# Patient Record
Sex: Male | Born: 1970
Health system: Southern US, Community
[De-identification: ages and names within clinical notes are randomized; demographics above are authoritative.]

## PROBLEM LIST (undated history)

## (undated) DIAGNOSIS — E119 Type 2 diabetes mellitus without complications: Secondary | ICD-10-CM

## (undated) DIAGNOSIS — I10 Essential (primary) hypertension: Secondary | ICD-10-CM

## (undated) DIAGNOSIS — K219 Gastro-esophageal reflux disease without esophagitis: Secondary | ICD-10-CM

## (undated) DIAGNOSIS — E669 Obesity, unspecified: Secondary | ICD-10-CM

## (undated) HISTORY — PX: EYE SURGERY: SHX253

---

## 2014-05-03 ENCOUNTER — Emergency Department (HOSPITAL_COMMUNITY): Payer: Managed Care, Other (non HMO)

## 2014-05-03 ENCOUNTER — Encounter (HOSPITAL_COMMUNITY): Payer: Self-pay | Admitting: Emergency Medicine

## 2014-05-03 ENCOUNTER — Observation Stay (HOSPITAL_COMMUNITY)
Admission: EM | Admit: 2014-05-03 | Discharge: 2014-05-05 | Disposition: A | Discharge status: Home / Self Care | Payer: Managed Care, Other (non HMO) | Attending: Family Medicine | Admitting: Family Medicine

## 2014-05-03 DIAGNOSIS — R079 Chest pain, unspecified: Secondary | ICD-10-CM | POA: Diagnosis not present

## 2014-05-03 DIAGNOSIS — I10 Essential (primary) hypertension: Secondary | ICD-10-CM | POA: Insufficient documentation

## 2014-05-03 DIAGNOSIS — Z79899 Other long term (current) drug therapy: Secondary | ICD-10-CM | POA: Insufficient documentation

## 2014-05-03 DIAGNOSIS — R61 Generalized hyperhidrosis: Secondary | ICD-10-CM | POA: Diagnosis not present

## 2014-05-03 DIAGNOSIS — E669 Obesity, unspecified: Secondary | ICD-10-CM | POA: Insufficient documentation

## 2014-05-03 DIAGNOSIS — R197 Diarrhea, unspecified: Secondary | ICD-10-CM | POA: Insufficient documentation

## 2014-05-03 DIAGNOSIS — R110 Nausea: Secondary | ICD-10-CM | POA: Insufficient documentation

## 2014-05-03 DIAGNOSIS — R072 Precordial pain: Secondary | ICD-10-CM

## 2014-05-03 DIAGNOSIS — R42 Dizziness and giddiness: Secondary | ICD-10-CM | POA: Diagnosis not present

## 2014-05-03 DIAGNOSIS — E663 Overweight: Secondary | ICD-10-CM

## 2014-05-03 DIAGNOSIS — R000 Tachycardia, unspecified: Secondary | ICD-10-CM | POA: Insufficient documentation

## 2014-05-03 DIAGNOSIS — Z6841 Body Mass Index (BMI) 40.0 and over, adult: Secondary | ICD-10-CM

## 2014-05-03 HISTORY — DX: Obesity, unspecified: E66.9

## 2014-05-03 HISTORY — DX: Essential (primary) hypertension: I10

## 2014-05-03 LAB — CBC
HCT: 46.1 % (ref 39.0–52.0)
HEMOGLOBIN: 15 g/dL (ref 13.0–17.0)
MCH: 28.4 pg (ref 26.0–34.0)
MCHC: 32.5 g/dL (ref 30.0–36.0)
MCV: 87.1 fL (ref 78.0–100.0)
PLATELETS: 343 10*3/uL (ref 150–400)
RBC: 5.29 MIL/uL (ref 4.22–5.81)
RDW: 12.8 % (ref 11.5–15.5)
WBC: 10 10*3/uL (ref 4.0–10.5)

## 2014-05-03 LAB — BASIC METABOLIC PANEL
ANION GAP: 16 — AB (ref 5–15)
BUN: 15 mg/dL (ref 6–23)
CALCIUM: 10 mg/dL (ref 8.4–10.5)
CHLORIDE: 94 meq/L — AB (ref 96–112)
CO2: 27 meq/L (ref 19–32)
Creatinine, Ser: 0.81 mg/dL (ref 0.50–1.35)
GFR calc Af Amer: >90 mL/min (ref >90)
GFR calc non Af Amer: >90 mL/min (ref >90)
GLUCOSE: 102 mg/dL — AB (ref 70–99)
Potassium: 3.9 mEq/L (ref 3.7–5.3)
Sodium: 137 mEq/L (ref 137–147)

## 2014-05-03 LAB — I-STAT TROPONIN, ED: Troponin i, poc: 0.02 ng/mL (ref 0.00–0.08)

## 2014-05-03 LAB — PRO B NATRIURETIC PEPTIDE: Pro B Natriuretic peptide (BNP): <5 pg/mL (ref 0–125)

## 2014-05-03 MED ORDER — ASPIRIN 81 MG PO CHEW
324.0000 mg | CHEWABLE_TABLET | Freq: Once | ORAL | Status: AC
  Administered 2014-05-03: 324 mg via ORAL
  Filled 2014-05-03: qty 4

## 2014-05-03 MED ORDER — SODIUM CHLORIDE 0.9 % IV BOLUS (SEPSIS)
1000.0000 mL | Freq: Once | INTRAVENOUS | Status: AC
  Administered 2014-05-03: 1000 mL via INTRAVENOUS

## 2014-05-03 MED ORDER — NITROGLYCERIN 0.4 MG SL SUBL
0.4000 mg | SUBLINGUAL_TABLET | SUBLINGUAL | Status: DC | PRN
  Administered 2014-05-03 (×2): 0.4 mg via SUBLINGUAL
  Filled 2014-05-03: qty 1

## 2014-05-03 NOTE — H&P (Signed)
Family Medicine Teaching [facility] Admission History and Physical Service Pager: [phone]  Patient name: Edwin SilversmithJoseph Luster Medical record number: [id number] Date of birth: 05/10/1971 Age: 43 y.o. Gender: male  Primary Care Provider: No primary provider on file. Consultants: None Code Status: Full code  Chief Complaint: Chest pain  Assessment and Plan: Edwin Cox is a 43 y.o. male presenting with substernal chest pain. PMH is significant for HTN, morbid obesity, former smoker, GERD.  #Chest pain: Typical sounding chest pain (substernal pressure, radiation to L arm, diaphoresis, relieved with nitro).  RFs for ACS include HTN, morbid obesity and former smoker. No EKG changes, iStat troponin negative.  Must also consider GI etiology given h/o GERD (though has been asymptomatic off medications since he quit smoking ~3 yrs ago). Consider PNA (though no fever and normal CXR), pneumothorax (unlikely given normal CXR), and PE (given tachycardia, unlikely though given no O2 requirement or tachypnea, and no risk factors makes this less likely). - Observation on tele unit, attending McDiarmid - Cycle Troponins x3 - Repeat EKG in AM - Risk stratification labs in AM: Hgb A1c, TSH, lipid panel - Cardiology consult in AM (per chest pain order-set) - SL Nitro prn, Tylenol prn for pain control - Aspirin 325mg  daily - monitor on telemetry O/N   # Sinus tachycardia: Tachycardic to 130 in ED. Likely related to pain and volume depletion. Improving s/p Nitro and getting IVF currently. No signs concerning for PE currently (no dyspnea, O2 requirement, recent travel/surgery/trauma, hemoptysis, calf tenderness). - Continue to monitor on telemetry  #HTN: Mildly hypotensive after receiving Nitroglycerin. Now well controlled. - Continue home Lisinopril. Patient unsure of dose. Will start on 10mg  daily and confirm dose with pharmacy in AM. - Continue to monitor closely  FEN/GI: Heart healthy diet,  SLIV Prophylaxis: Lovenox  Disposition: Admit to FPTS for observation, attending McDiarmid. Dispo pending cardiac eval.  History of Present Illness: Edwin SilversmithJoseph Cantin is a 43 y.o. male presenting with chest pain.  PMH significant for HTN, former smoker, morbid obesity, GERD.  Patient was in his usual state of health until around 5pm.  He was sitting in his office at work and upon standing felt sudden onset substernal chest pain associated with nausea, diaphoresis.  Pain radiated down his L arm.  He describes the pain as squeezing and pressure.  After pain was present for ~1h, patient came to Urgent care as he was concerned that he could be having an MI.  He was sent to the ED from there.    He has h/o GERD and used to take medication for it.  After he quit smoking ~3564yrs ago, he no longer needed medication and has had no symptoms since.  He denies that this feels like his GERD symptoms did.  Denies any recent travel/trauma/surgery, SOB, hemoptysis, abd pain.  In the ED: Aspirin 325mg  x1, SL Nitro x2, 1L NS bolus -> Pain improved with nitroglycerin.  Review Of Systems: Per HPI with the following additions: none Otherwise 12 point review of systems was performed and was unremarkable.  Patient Active Problem List   Diagnosis Date Noted  . Chest pain 05/03/2014   Past Medical History: Past Medical History  Diagnosis Date  . Hypertension   . Obesity    Past Surgical History: Past Surgical History  Procedure Laterality Date  . Eye surgery     Social History: History  Substance Use Topics  . Smoking status: Never Smoker   . Smokeless tobacco: Not on file  . Alcohol Use:  Yes   Additional social history: Former smoker for ~23 years, ~1PPD. Quit ~3 years ago. No EtOH/drugs.  Please also refer to relevant sections of EMR.  Family History: History reviewed. No pertinent family history. Allergies and Medications: No Known Allergies No current facility-administered medications on file  prior to encounter.   No current outpatient prescriptions on file prior to encounter.    Objective: BP 129/88  Pulse 108  Temp(Src) 98.8 F (37.1 C) (Oral)  Resp 17  Ht 6' (1.829 m)  Wt 362 lb 12.8 oz (164.565 kg)  BMI 49.19 kg/m2  SpO2 97% Exam: General: Obese man sitting in bed in NAD HEENT: NCAT, EOMI, PERRL, MMM, OP clear Cardiovascular: Tachycardic, regular rhythm, no m/r/g Respiratory: CTAB, no w/r/c. Normal WOB, satting well on RA Abdomen: Obese, soft, NDNT. No masses/organomegaly appreciated, though difficult 2/2 habitus Extremities: WWP, no edema Skin: No rashes noted Neuro: CN 2-12 grossly intact. Speech normal. No focal deficits.  Labs and Imaging: CBC BMET   Recent Labs Lab 05/03/14 2015  WBC 10.0  HGB 15.0  HCT 46.1  PLT 343    Recent Labs Lab 05/03/14 2015  NA 137  K 3.9  CL 94*  CO2 27  BUN 15  CREATININE 0.81  GLUCOSE 102*  CALCIUM 10.0      iStat tropionin 0.02 ProBNP <5  CXR (10/29): No evidence of acute cardiopulmonary disease.  EKG (10/29): Sinus tachycardia, no evidence of ischemia   Shirlee LatchAngela Bacigalupo, MD 05/04/2014, 1:12 AM PGY-1, [facility] FPTS Intern pager: [phone], text pages welcome   Upper Level Addendum:  I have seen and evaluated this patient along with Dr. Beryle FlockBacigalupo and reviewed the above note, making necessary revisions in red.   Marikay AlarEric Jennett Tarbell, MD Family Medicine PGY-2

## 2014-05-03 NOTE — ED Notes (Signed)
Pt. reports mid chest pain / tightness with mild SOB onset this evening , headache and nausea .

## 2014-05-03 NOTE — ED Provider Notes (Signed)
CSN: [id number]     Arrival date & time 05/03/14  1946 History   First MD Initiated Contact with Patient 05/03/14 2058     Chief Complaint  Patient presents with  . Chest Pain   Patient is a 43 y.o. male presenting with chest pain. The history is provided by the patient.  Chest Pain Pain location:  L chest Pain quality: pressure   Pain quality comment:  "squeezing" Pain radiates to:  L arm Pain radiates to the back: no   Pain severity:  Moderate Onset quality:  Sudden Duration:  4 hours Timing:  Constant Progression:  Unchanged Chronicity:  New Context: not breathing and not at rest   Context comment:  Walking Relieved by:  None tried Worsened by:  Nothing tried Ineffective treatments:  None tried Associated symptoms: anxiety, diaphoresis and nausea   Associated symptoms: no abdominal pain, no altered mental status, no cough, no fever, no headache, no lower extremity edema, no palpitations, no syncope and not vomiting   Associated symptoms comment:  Lightheadedness Nausea:    Severity:  Severe   Onset quality:  Sudden   Duration:  4 hours   Timing:  Constant   Progression:  Partially resolved Risk factors: hypertension, male sex and obesity   Risk factors: no coronary artery disease, no diabetes mellitus, no high cholesterol, no immobilization, no prior DVT/PE and no smoking (former smoker; quit 2 years ago)     Past Medical History  Diagnosis Date  . Hypertension   . Obesity    Past Surgical History  Procedure Laterality Date  . Eye surgery     No family history on file. History  Substance Use Topics  . Smoking status: Never Smoker   . Smokeless tobacco: Not on file  . Alcohol Use: Yes    Review of Systems  Constitutional: Positive for diaphoresis. Negative for fever.  Respiratory: Negative for cough.   Cardiovascular: Positive for chest pain. Negative for palpitations, leg swelling and syncope.  Gastrointestinal: Positive for nausea and diarrhea. Negative  for vomiting and abdominal pain.  Musculoskeletal: Negative for gait problem.  Neurological: Positive for light-headedness. Negative for headaches.  All other systems reviewed and are negative.   Allergies  Review of patient's allergies indicates no known allergies.  Home Medications   Prior to Admission medications   Medication Sig Start Date End Date Taking? Authorizing Provider  LISINOPRIL PO Take 1 tablet by mouth daily.   Yes Historical Provider, MD  PHENTERMINE HCL PO Take 1 tablet by mouth daily.   Yes Historical Provider, MD   BP 138/94  Pulse 118  Temp(Src) 98.3 F (36.8 C) (Oral)  Resp 14  Ht 6' (1.829 m)  Wt 360 lb (163.295 kg)  BMI 48.81 kg/m2  SpO2 98%  Physical Exam  Vitals reviewed. Constitutional: He is oriented to person, place, and time. He appears well-developed and well-nourished. No distress.  HENT:  Head: Normocephalic and atraumatic.  Right Ear: External ear normal.  Left Ear: External ear normal.  Nose: Nose normal.  Mouth/Throat: Uvula is midline and oropharynx is clear and moist.  Eyes: EOM are normal. Pupils are equal, round, and reactive to light.  Neck: Normal range of motion.  Cardiovascular: Regular rhythm.  Tachycardia present.   Pulses:      Radial pulses are 2+ on the right side, and 2+ on the left side.       Dorsalis pedis pulses are 2+ on the right side, and 2+ on the left side.  No peripheral edema  Pulmonary/Chest: Effort normal and breath sounds normal. No respiratory distress. He has no wheezes. He has no rales.  Abdominal: Soft. He exhibits no distension. There is no tenderness. There is no rebound and no guarding.  Neurological: He is alert and oriented to person, place, and time.  Skin: Skin is warm and dry. No rash noted. He is not diaphoretic.  Psychiatric: His mood appears anxious.    ED Course  Procedures Labs Review  Results for orders placed during the hospital encounter of 05/03/14  CBC      Result Value Ref  Range   WBC 10.0  4.0 - 10.5 K/uL   RBC 5.29  4.22 - 5.81 MIL/uL   Hemoglobin 15.0  13.0 - 17.0 g/dL   HCT 72.546.1  36.639.0 - 44.052.0 %   MCV 87.1  78.0 - 100.0 fL   MCH 28.4  26.0 - 34.0 pg   MCHC 32.5  30.0 - 36.0 g/dL   RDW 34.712.8  42.511.5 - 95.615.5 %   Platelets 343  150 - 400 K/uL  BASIC METABOLIC PANEL      Result Value Ref Range   Sodium 137  137 - 147 mEq/L   Potassium 3.9  3.7 - 5.3 mEq/L   Chloride 94 (*) 96 - 112 mEq/L   CO2 27  19 - 32 mEq/L   Glucose, Bld 102 (*) 70 - 99 mg/dL   BUN 15  6 - 23 mg/dL   Creatinine, Ser 3.870.81  0.50 - 1.35 mg/dL   Calcium 56.410.0  8.4 - 33.210.5 mg/dL   GFR calc non Af Amer >90  >90 mL/min   GFR calc Af Amer >90  >90 mL/min   Anion gap 16 (*) 5 - 15  PRO B NATRIURETIC PEPTIDE      Result Value Ref Range   Pro B Natriuretic peptide (BNP) <5.0  0 - 125 pg/mL  I-STAT TROPOININ, ED      Result Value Ref Range   Troponin i, poc 0.02  0.00 - 0.08 ng/mL   Comment 3             Imaging Review Dg Chest 2 View  05/03/2014   CLINICAL DATA:  Chest pain, shortness of breath  EXAM: CHEST  2 VIEW  COMPARISON:  None.  FINDINGS: Lungs are clear.  No pleural effusion or pneumothorax.  The heart is normal in size.  Mild degenerative changes of the visualized thoracolumbar spine.  IMPRESSION: No evidence of acute cardiopulmonary disease.   Electronically Signed   By: Charline BillsSriyesh  Krishnan M.D.   On: 05/03/2014 21:02     EKG Interpretation   Date/Time:  Thursday May 03 2014 21:42:05 EDT Ventricular Rate:  113 PR Interval:  154 QRS Duration: 92 QT Interval:  324 QTC Calculation: 444 R Axis:   62 Text Interpretation:  Sinus tachycardia RSR' in V1 or V2, right VCD or RVH  No significant change was found Confirmed by CAMPOS  MD, Caryn BeeKEVIN ([id number]) on  05/03/2014 11:08:12 PM      MDM   Final diagnoses:  Chest pain    43 y.o. male with a history of obesity and hypertension presents to the ED due to sudden onset chest pain radiating to his left arm associated with  lightheadedness, nausea, and diaphoresis. Denies fever, chills, cough, abdominal pain. Has not smoked for 2 years. No first degree relative with ACS.   Pain relieved with aspirin and 2 sublingual nitroglycerin down to a "zero to  one".  Concern for ACS. Low suspicion for PE, Pneumonia, Pneumothorax, Aortic Dissection.   Initial troponin negative. BNP normal. CBC and BMP normal.  Repeat EKG with no changes from first EKG.   Given his presentation, very concerning for ACS, feel he needs serial troponin testing along with serial EKGs and a stress test.   He was admitted to family medicine. Repeat troponin pending at the time of admission.   This case managed in conjunction with my attending, Dr. Patria Maneampos.    Maxine GlennAnn Clemens Lachman, MD 05/03/14 [phone]

## 2014-05-04 ENCOUNTER — Encounter (HOSPITAL_COMMUNITY): Payer: Self-pay | Admitting: General Practice

## 2014-05-04 ENCOUNTER — Observation Stay (HOSPITAL_COMMUNITY): Payer: Managed Care, Other (non HMO)

## 2014-05-04 DIAGNOSIS — R079 Chest pain, unspecified: Secondary | ICD-10-CM

## 2014-05-04 DIAGNOSIS — I517 Cardiomegaly: Secondary | ICD-10-CM

## 2014-05-04 DIAGNOSIS — I10 Essential (primary) hypertension: Secondary | ICD-10-CM

## 2014-05-04 DIAGNOSIS — I471 Supraventricular tachycardia: Secondary | ICD-10-CM

## 2014-05-04 DIAGNOSIS — Z6841 Body Mass Index (BMI) 40.0 and over, adult: Secondary | ICD-10-CM

## 2014-05-04 DIAGNOSIS — E669 Obesity, unspecified: Secondary | ICD-10-CM

## 2014-05-04 LAB — CREATININE, SERUM
Creatinine, Ser: 0.86 mg/dL (ref 0.50–1.35)
GFR calc Af Amer: >90 mL/min (ref >90)
GFR calc non Af Amer: >90 mL/min (ref >90)

## 2014-05-04 LAB — TROPONIN I: Troponin I: <0.3 ng/mL (ref <0.30)

## 2014-05-04 LAB — LIPID PANEL
CHOL/HDL RATIO: 4.6 ratio
CHOLESTEROL: 160 mg/dL (ref 0–200)
HDL: 35 mg/dL — ABNORMAL LOW (ref >39)
LDL Cholesterol: 84 mg/dL (ref 0–99)
Triglycerides: 203 mg/dL — ABNORMAL HIGH (ref <150)
VLDL: 41 mg/dL — AB (ref 0–40)

## 2014-05-04 LAB — HEMOGLOBIN A1C
HEMOGLOBIN A1C: 5.5 % (ref <5.7)
MEAN PLASMA GLUCOSE: 111 mg/dL (ref <117)

## 2014-05-04 LAB — CBC
HCT: 46.1 % (ref 39.0–52.0)
Hemoglobin: 15.3 g/dL (ref 13.0–17.0)
MCH: 28.9 pg (ref 26.0–34.0)
MCHC: 33.2 g/dL (ref 30.0–36.0)
MCV: 87.1 fL (ref 78.0–100.0)
PLATELETS: 363 10*3/uL (ref 150–400)
RBC: 5.29 MIL/uL (ref 4.22–5.81)
RDW: 13 % (ref 11.5–15.5)
WBC: 10.3 10*3/uL (ref 4.0–10.5)

## 2014-05-04 LAB — TSH: TSH: 1.2 u[IU]/mL (ref 0.350–4.500)

## 2014-05-04 LAB — I-STAT TROPONIN, ED: TROPONIN I, POC: 0.01 ng/mL (ref 0.00–0.08)

## 2014-05-04 MED ORDER — ASPIRIN EC 81 MG PO TBEC
81.0000 mg | DELAYED_RELEASE_TABLET | Freq: Every day | ORAL | Status: DC
  Administered 2014-05-04 – 2014-05-05 (×2): 81 mg via ORAL
  Filled 2014-05-04 (×2): qty 1

## 2014-05-04 MED ORDER — ENOXAPARIN SODIUM 80 MG/0.8ML ~~LOC~~ SOLN
80.0000 mg | Freq: Every day | SUBCUTANEOUS | Status: DC
  Administered 2014-05-04 – 2014-05-05 (×2): 80 mg via SUBCUTANEOUS
  Filled 2014-05-04 (×2): qty 0.8

## 2014-05-04 MED ORDER — PANTOPRAZOLE SODIUM 40 MG PO TBEC
40.0000 mg | DELAYED_RELEASE_TABLET | Freq: Every day | ORAL | Status: DC
  Administered 2014-05-04 – 2014-05-05 (×2): 40 mg via ORAL
  Filled 2014-05-04 (×2): qty 1

## 2014-05-04 MED ORDER — LISINOPRIL 5 MG PO TABS
5.0000 mg | ORAL_TABLET | Freq: Every day | ORAL | Status: DC
  Administered 2014-05-04 – 2014-05-05 (×2): 5 mg via ORAL
  Filled 2014-05-04 (×2): qty 1

## 2014-05-04 MED ORDER — ASPIRIN EC 325 MG PO TBEC: 325.0000 mg | DELAYED_RELEASE_TABLET | Freq: Every day | ORAL | Status: DC

## 2014-05-04 MED ORDER — ACETAMINOPHEN 325 MG PO TABS: 650.0000 mg | ORAL_TABLET | ORAL | Status: DC | PRN

## 2014-05-04 MED ORDER — LISINOPRIL 10 MG PO TABS: 10.0000 mg | ORAL_TABLET | Freq: Every day | ORAL | Status: DC

## 2014-05-04 MED ORDER — REGADENOSON 0.4 MG/5ML IV SOLN
INTRAVENOUS | Status: AC
  Administered 2014-05-04: 0.4 mg
  Filled 2014-05-04: qty 5

## 2014-05-04 MED ORDER — ZOLPIDEM TARTRATE 5 MG PO TABS
5.0000 mg | ORAL_TABLET | Freq: Once | ORAL | Status: AC
  Administered 2014-05-04: 5 mg via ORAL
  Filled 2014-05-04: qty 1

## 2014-05-04 MED ORDER — ONDANSETRON HCL 4 MG/2ML IJ SOLN: 4.0000 mg | Freq: Four times a day (QID) | INTRAMUSCULAR | Status: DC | PRN

## 2014-05-04 NOTE — Progress Notes (Addendum)
Family Medicine Teaching Service Daily Progress Note Intern Pager: [phone]  Patient name: Edwin SilversmithJoseph Massmann Medical record number: [id number] Date of birth: 04/27/1971 Age: 43 y.o. Gender: male  Primary Care Provider: No primary provider on file. [facility] [place] Consultants: cardiology Code Status: FULL  Pt Overview and Major Events to Date:  05/03/14: patient admitted with substernal CP, resolved with SL nitro x2  Assessment and Plan: Edwin SilversmithJoseph Cox is a 43 y.o. male presenting with substernal chest pain. PMH is significant for HTN, morbid obesity, former smoker, GERD.   #Chest pain: Typical sounding chest pain (substernal pressure, radiation to L arm, diaphoresis, relieved with nitro). RFs for ACS include HTN, morbid obesity and former smoker. No EKG changes, iStat troponin negative. Must also consider GI etiology given h/o GERD (though has been asymptomatic off medications since he quit smoking ~3 yrs ago).  - on tele unit - Troponins negative x3  - EKG this am with no significant changes - Cardiology rec nuclear stress test and echo today - SL Nitro prn, Tylenol prn for pain control  - Aspirin 81mg  daily  - Risk stratification-->1.3% (if put in nonsmoker) - 4.1% (if put in smoker given prior history) [will need to recalculate if A1c comes back indicating diabetes] - Still favor starting him on a statin given his typical angina and obesity; will discuss with PCP today  # Sinus tachycardia: Resolved. Tachycardic to 130 in ED. Likely related to pain and volume depletion. Improving s/p Nitro and getting IVF currently. No signs concerning for PE currently (no dyspnea, O2 requirement, recent travel/surgery/trauma, hemoptysis, calf tenderness).  - Continue to monitor on telemetry   #HTN: Mildly hypotensive after receiving Nitroglycerin. Now with some continued soft pressures. - decreased lisinopril dose to 5mg  - Continue to monitor closely  - f/u with PCP  about home lisinopril dose as patient is unsure  #Obesity: Patient on phentermine at home for weight loss.  -As phentermine has undetermined cardiac risk, appreciate cardiology recs on continuing this medication  FEN/GI: NPO for stress test, SLIV  Prophylaxis: Lovenox   Disposition: observation, attending McDiarmid. Dispo pending stress test and echo   Subjective:  Feels back to normal self this morning. No further chest pain. No sob or LE swelling.   Objective: Temp:  [97.9 F (36.6 C)-98.8 F (37.1 C)] 97.9 F (36.6 C) (10/30 0445) Pulse Rate:  [97-130] 97 (10/30 0445) Resp:  [12-24] 17 (10/30 0006) BP: (98-138)/(34-94) 109/62 mmHg (10/30 0445) SpO2:  [94 %-98 %] 96 % (10/30 0445) Weight:  [360 lb (163.295 kg)-362 lb 12.8 oz (164.565 kg)] 362 lb 12.8 oz (164.565 kg) (10/30 0052)  Physical Exam: General: Obese man sitting up in chair watching TV, in NAD Cardiovascular: RRR, normal S1, S2, no MRG Respiratory: CTAB, no wheezes or crackles appreciated, normal WOB Abdomen: obese, soft, nontender to palpation, no masses or HSM appreciated Extremities: no edema, dorsalis pedis pulses 2+ bilaterally  Laboratory:  Recent Labs Lab 05/03/14 2015 05/04/14 0125  WBC 10.0 10.3  HGB 15.0 15.3    Recent Labs Lab 05/03/14 2015 05/04/14 0125  HCT 46.1 46.1  PLT 343 363    Recent Labs Lab 05/03/14 2015 05/04/14 0125  NA 137  --   K 3.9  --   CL 94*  --   CO2 27  --   BUN 15  --   CREATININE 0.81 0.86  CALCIUM 10.0  --   GLUCOSE 102*  --     iStat tropionin 0.02  ProBNP <5 Troponins x3: <0.30  Imaging/Diagnostic Tests: CXR (10/29): No evidence of acute cardiopulmonary disease.  EKG (10/29): Sinus tachycardia, no evidence of ischemia   Alvester MorinLiza Straub, MS4 FPTS Intern pager: [phone], text pages welcome  I have seen and examined this patient with the medical student and agree with her assessment and plan with my additions here. Briefly, 43 y.o. male with  substernal chest pain with associated pressure, radiation to left arm, diaphoresis and relief with nitro receiving chest pain workup.   S: Reports doing well with no further chest pain this morning and no diaphoresis. No other complaints.  O:  BP 109/62  Pulse 97  Temp(Src) 97.9 F (36.6 C) (Oral)  Resp 17  Ht 6' (1.829 m)  Wt 362 lb 12.8 oz (164.565 kg)  BMI 49.19 kg/m2  SpO2 96%  GEN: NAD, lying in bed CV: RRR though distant due to habitus PULM: CTAB, normal effort, no wheezing or crackles ABD: Obese, soft, nontender, nondistended EXTR: No LE edema or calf tenderness CHEST: No reproducibility to chest pain  A/P: 43 y.o. male with substernal chest pain with associated pressure, radiation to left arm, diaphoresis and relief with nitro receiving chest pain workup.  # Chest pain - Troponin negative x 2, repeat EKG this morning is stable with nonspecific T wave abnormalities and no further sinus tachycardia though high end of nl. Will decrease asa to 81mg  daily. F/u risk stratification labs and cardiology consult recommends echo and nuclear stress test. Will recommend statin to PCP though risk is 4.1% even if consider him a smoker (with his hx of smoking). If normal per them can discharge today. Ordered NPO for this. Requesting cards recs re: phentermine safety for weight loss in this patient. # Hypertension - Will need to clarify lisinopril dose today. Will also decrease to 5mg  in setting of borderline low BPs.  # Sinus tachycardia - Present on admission, thought due to pain and volume depletion but improving last night. No concerning signs for PE (dyspnea, O2 requirement, recent travel/surgery/trauma, hemoptysis, or calf tenderness).  # Dispo - Pending chest pain workup and cardiology recs.   Leona SingletonMaria T Kylee Nardozzi, MD  PGY-3, [facility]

## 2014-05-04 NOTE — H&P (Signed)
I have seen and examined this patient. I have discussed with Dr Art BuffSonnenbery.  I agree with their findings and plans as documented in their admission note.  Acute issues 1. Substernal chest pain, acute - HEART score: 3 - Relief with NTG x2 - No current chest pain - Has normal troponins - Unremarkable ECG - Awaiting Nuclear cardiac study - Pain relief with NTG differential includes  ACS / prinzmetal angina, Esophageal smooth-muscle origin of pain, biliary tract smooth-muscle origin of pain.   Recommendation  If Nuclear cardiac study is reassuring, then consider empiric 8 weeks of high dose PPI, e.g. Protonix 40 mg daily, for possible erosive esophagitis.   If recurrent, consider RUQ US to assess biliary tract.

## 2014-05-04 NOTE — Progress Notes (Signed)
UR completed 

## 2014-05-04 NOTE — Progress Notes (Signed)
  Echocardiogram 2D Echocardiogram has been performed.  Edwin Cox FRANCES 05/04/2014, 12:25 PM

## 2014-05-04 NOTE — Discharge Summary (Signed)
Family Medicine [facility]  Hospital Discharge Summary  Patient name: Edwin Cox Medical record Cox: [id number]  Date of birth: 06/03/1971 Age: 43 y.o. Gender: male  Date of Admission: 05/03/2014 Date of Discharge: 05/04/2014  Admitting Physician: Leighton Roachodd D McDiarmid, MD  Primary Care Provider: [facility] Ramond MarrowBronstein, [facility] Consultants: Cardiology   Indication for Hospitalization: chest pain   Discharge Diagnoses/Problem List:  - HTN - Morbid obesity - typical angina  Disposition: to home    Discharge Condition: stable   Discharge Exam: please refer to progress note from day of discharge   Brief Hospital Course:  Edwin Cox is a 43 y.o. male who presented with substernal chest pain. PMH is significant for HTN, morbid obesity, former smoker, GERD. His hospital course is outlined by problem below:  #Chest pain: Presented to ED with typical sounding angina (substernal pressure, radiation to L arm, diaphoresis, relieved with nitro). Pain relieved with 2 doses of sublingual nitroglycerin. No EKG changes, Troponins x3 negative. Given significant risk factors, cardiology recommended an echo and nuclear stress test. The results of these are as detailed below in radiology results. He was risk stratified and determined to have a 1.3 - 4.1% risk (depending on if put in smoking given his past history). Even though this risk does not meet criteria of >7.5% 10-year ASCVD risk, still recommend starting statin as outpatient.  #HTN: Hx of hypertension, takes lisinopril-HCTZ 20/12.5 mg twice daily at home. Upon receiving nitroglycerin, was mildly hypotensive. Mildly hypotensive after receiving Nitroglycerin.Was put on 5mg  lisinopril here due to this. Will d/c him on home regimen.  #Obesity: Patient on phentermine 15mg  daily at home for weight loss. As phentermine has undetermined cardiac risk, cardiology was asked to advise on patient's use of this medication. Advised discussion with  PCP.   Issues for Follow Up:  - risk factor optimization: start statin - Consider stopping phentermine (undetermined cardiac risk)  Significant Procedures: none   Significant Labs and Imaging:   Recent Labs   Recent Labs Lab 05/03/14 2015 05/04/14 0125 05/05/14 0354  NA 137  --  138  K 3.9  --  4.5  CL 94*  --  99  CO2 27  --  26  BUN 15  --  15  CREATININE 0.81 0.86 0.74  GLUCOSE 102*  --  106*    Recent Labs Lab 05/03/14 2015 05/04/14 0125 05/05/14 0354  WBC 10.0 10.3 7.6  HGB 15.0 15.3 14.0  HCT 46.1 46.1 43.0  PLT 343 363 308    Recent Labs Lab 05/04/14 0351  TSH 1.200   Lipid Panel     Component Value Date/Time   CHOL 160 05/04/2014 0335   TRIG 203* 05/04/2014 0335   HDL 35* 05/04/2014 0335   CHOLHDL 4.6 05/04/2014 0335   VLDL 41* 05/04/2014 0335   LDLCALC 84 05/04/2014 0335   A1c: 5.5  EKG: mild nonspecific ST-T wave changes. There is question of left atrial abnormality.  Dg Chest 2 View  05/03/2014 CLINICAL DATA: Chest pain, shortness of breath EXAM: CHEST 2 VIEW COMPARISON: None. FINDINGS: Lungs are clear. No pleural effusion or pneumothorax. The heart is normal in size. Mild degenerative changes of the visualized thoracolumbar spine. IMPRESSION: No evidence of acute cardiopulmonary disease. Electronically Signed By: Charline BillsSriyesh Krishnan M.D. On: 05/03/2014 21:02   Echo: - Left ventricle: Technically difficult study. The cavity size was normal. Wall thickness was increased in a pattern of mild LVH. The estimated ejection fraction was 60%. Wall motion was normal; there  were no regional wall motion abnormalities. - Left atrium: The atrium was mildly dilated. - Right ventricle: The cavity size was normal. Systolic function was normal.  Nuclear stress test: apical defect noted on day 1 , day 2 normal    Results/Tests Pending at Time of Discharge: none   Discharge Medications:    Medication List    STOP taking these medications         LISINOPRIL PO     PHENTERMINE HCL PO      TAKE these medications        aspirin 81 MG EC tablet  Take 1 tablet (81 mg total) by mouth daily.     lisinopril-hydrochlorothiazide 20-12.5 MG per tablet  Commonly known as:  PRINZIDE,ZESTORETIC  Take 2 tablets by mouth daily.     nitroGLYCERIN 0.4 MG SL tablet  Commonly known as:  NITROSTAT  Place 1 tablet (0.4 mg total) under the tongue every 5 (five) minutes as needed for chest pain.        Discharge Instructions: Please refer to Patient Instructions section of EMR for full details. Patient was counseled important signs and symptoms that should prompt return to medical care, changes in medications, dietary instructions, activity restrictions, and follow up appointments.   Follow-Up Appointments: Follow-up Information    Follow up with Your PCP (Dr. Derrill KayBrownstein). Schedule an appointment as soon as possible for a visit in 1 week.   Why:  Call your PCP to make a follow-up appointment in about 1 week.      Shirlee LatchAngela Bacigalupo, MD, MPH PGY-1,  [facility] 05/08/2014 9:07 PM

## 2014-05-04 NOTE — Consult Note (Signed)
CARDIOLOGY CONSULT NOTE   Patient ID: Edwin Cox MRN: [id number] DOB/AGE: 42/08/1970 42 y.o.  Admit Date: 05/03/2014  Primary Physician: No primary provider on file.  Primary Cardiologist     Narda RutherfordNew    Emilie Carp   Clinical Summary Edwin Cox is a 43 y.o.male .   He was admitted with chest discomfort. There is no prior significant cardiac history. He is overweight and has hypertension. Yesterday while at work he had some mild dizziness and sweating and then began to have tightness in his chest. He was concerned about this and eventually went to urgent care. He was not seen officially at urgent care but sent to the emergency room. In the emergency room he received nitroglycerin. He says that the first nitroglycerin helped a little. After the second nitroglycerin his pain was gone. He described his pain as squeezing in the center of his chest. There was radiation to the left elbow. His pain is gone today. His pain lasted in the range of 4 hours between onset and feeling of improvement after nitroglycerin. It is of note that he did have hypotension after his second nitroglycerin. EKGs reveal sinus tachycardia. There are mild nonspecific ST-T wave changes. There is question of mild left atrial abnormality. Troponins are normal.   No Known Allergies  Medications Scheduled Medications: . aspirin EC  325 mg Oral Daily  . enoxaparin (LOVENOX) injection  80 mg Subcutaneous Daily  . lisinopril  10 mg Oral Daily     Infusions:     PRN Medications:  acetaminophen, nitroGLYCERIN, ondansetron (ZOFRAN) IV   Past Medical History  Diagnosis Date  . Hypertension   . Obesity     Past Surgical History  Procedure Laterality Date  . Eye surgery      Family history:   There is no history of significant coronary disease and the patient's parents or siblings. His grandfather did have coronary disease.  Social History Edwin Cox reports that he has never smoked. He does not have any  smokeless tobacco history on file. Edwin Cox reports that he drinks alcohol.  Review of Systems   Patient denies fever, chills, headache, sweats, rash, change in vision, change in hearing, cough, nausea or vomiting, urinary symptoms. All other systems are reviewed and are negative.  Physical Examination Blood pressure 109/62, pulse 97, temperature 97.9 F (36.6 C), temperature source Oral, resp. rate 17, height 6' (1.829 m), weight 362 lb 12.8 oz (164.565 kg), SpO2 96.00%. No intake or output data in the 24 hours ending 05/04/14 [id number]  Patient is overweight. He is oriented to person time and place. Affect is normal. He admits that he was quite anxious when he felt poorly yesterday. Head is atraumatic. He is quite stable and looks fine at this time. Sclera and conjunctiva are normal. There is no jugulovenous distention. There are no carotid bruits. Lungs are clear. Respiratory effort is not labored. Cardiac exam reveals an S1 and S2. The abdomen is soft. There is no peripheral edema. There are no musculoskeletal deformities. There are no skin rashes.   Prior Cardiac Testing/Procedures  Lab Results  Basic Metabolic Panel:  Recent Labs Lab 05/03/14 2015 05/04/14 0125  NA 137  --   K 3.9  --   CL 94*  --   CO2 27  --   GLUCOSE 102*  --   BUN 15  --   CREATININE 0.81 0.86  CALCIUM 10.0  --     Liver Function Tests: No results found  for this basename: AST, ALT, ALKPHOS, BILITOT, PROT, ALBUMIN,  in the last 168 hours  CBC:  Recent Labs Lab 05/03/14 2015 05/04/14 0125  WBC 10.0 10.3  HGB 15.0 15.3  HCT 46.1 46.1  MCV 87.1 87.1  PLT 343 363    Cardiac Enzymes:  Recent Labs Lab 05/04/14 0125 05/04/14 0335 05/04/14 0700  TROPONINI <0.30 <0.30 <0.30    BNP: No components found with this basename: POCBNP,    Radiology: Dg Chest 2 View  05/03/2014   CLINICAL DATA:  Chest pain, shortness of breath  EXAM: CHEST  2 VIEW  COMPARISON:  None.  FINDINGS: Lungs are clear.   No pleural effusion or pneumothorax.  The heart is normal in size.  Mild degenerative changes of the visualized thoracolumbar spine.  IMPRESSION: No evidence of acute cardiopulmonary disease.   Electronically Signed   By: Charline BillsSriyesh  Krishnan M.D.   On: 05/03/2014 21:02     ECG:   I have reviewed the current EKGs. There are no old EKGs for comparison. When he came into the hospital he had sinus tachycardia. His resting heart rate has remained around 100. He has mild nonspecific ST-T wave changes. There is question of left atrial abnormality.  Telemetry:     I have reviewed telemetry today May 04, 2014. There is sinus rhythm with mild sinus tachycardia.   Impression and Recommendations    Chest pain     The patient's presenting symptoms with chest pain and radiation in the left arm to the left elbow are concerning. So far there is no evidence of cardiac injury. There are mild nonspecific EKG changes. By history his chest pain was helped with 2 nitroglycerin. Troponins are normal. I feel that further in-hospital cardiac evaluation is appropriate. He will have a 2-D echo. I've considered whether it would be more appropriate to obtain a cardiac CT or a stress nuclear scan. His resting heart rate continues to be elevated. I am not concerned that we can slow his heart rate well enough to obtain good cardiac CT images. I feel that proceeding with a nuclear stress test is appropriate. The fact that he responded to nitroglycerin, encourage his me to do this study while he is in the hospital. If his echo and his nuclear stress study are normal, he could be discharged home later today.    Essential hypertension     He actually had hypotension with nitroglycerin. Blood pressure is now stable.    Overweight    Weight loss will be appropriate for him over time.  Jerral BonitoJeff Majel Giel, MD 05/04/2014, 8:23 AM

## 2014-05-04 NOTE — Progress Notes (Signed)
Stress portion of 2 day stress/ rest Myoview was not normal. He apparently had an apical defect. He will need to stay for rest images tomorrow. I reviewed this with the patient.  Corine ShelterLUKE KILROY PA-C 05/04/2014 3:12 PM Jerral BonitoJeff Sola Margolis, MD

## 2014-05-04 NOTE — Plan of Care (Signed)
Problem: Consults Goal: Skin Care Protocol Initiated - if Braden Score 18 or less If consults are not indicated, leave blank or document N/A Outcome: Not Applicable Date Met:  86/76/19 Braden > 18  Problem: Phase I Progression Outcomes Goal: Aspirin unless contraindicated Outcome: Completed/Met Date Met:  05/04/14 Pt given 324 mg of ASA in ED

## 2014-05-04 NOTE — ED Provider Notes (Signed)
I saw and evaluated the patient, reviewed the resident's note and I agree with the findings and plan.   EKG Interpretation   Date/Time:  Thursday May 03 2014 21:42:05 EDT Ventricular Rate:  113 PR Interval:  154 QRS Duration: 92 QT Interval:  324 QTC Calculation: 444 R Axis:   62 Text Interpretation:  Sinus tachycardia RSR' in V1 or V2, right VCD or RVH  No significant change was found Confirmed by Lamar Meter  MD, Caryn BeeKEVIN ([id number]) on  05/03/2014 11:08:12 PM      Patient with both typical and atypical components to his chest discomfort and symptoms tonight.  This could represent ACS.  My suspicion is low to moderate at this time.  I think the patient would benefit from observation in the hospital, cycle enzymes, provocative testing in the a.m.  I do not think he needs a cardiology consultation at this time.  If he has recurrence of his discomfort I have asked that the patient left his nursing and medical team know upstairs.  Strong family history of early cardiac disease as well as morbid obesity, sedentary lifestyle, hypertension.  Lyanne CoKevin M Malan Werk, MD 05/04/14 [phone]

## 2014-05-04 NOTE — Progress Notes (Signed)
FMTS Attending Note Patient's care discussed with resident team and I agree with Dr Lucienne Minkshekkekandam's note for today.  Patient with unstable anginal chest pain, for continued evaluation for this with ECHO, nuclear stress test. Paula ComptonJames Kamron Portee, MD

## 2014-05-05 ENCOUNTER — Ambulatory Visit (HOSPITAL_COMMUNITY): Payer: Managed Care, Other (non HMO)

## 2014-05-05 ENCOUNTER — Observation Stay (HOSPITAL_COMMUNITY): Payer: Managed Care, Other (non HMO)

## 2014-05-05 DIAGNOSIS — R072 Precordial pain: Secondary | ICD-10-CM

## 2014-05-05 DIAGNOSIS — E663 Overweight: Secondary | ICD-10-CM

## 2014-05-05 LAB — BASIC METABOLIC PANEL
Anion gap: 13 (ref 5–15)
BUN: 15 mg/dL (ref 6–23)
CO2: 26 mEq/L (ref 19–32)
CREATININE: 0.74 mg/dL (ref 0.50–1.35)
Calcium: 9.1 mg/dL (ref 8.4–10.5)
Chloride: 99 mEq/L (ref 96–112)
GFR calc non Af Amer: >90 mL/min (ref >90)
Glucose, Bld: 106 mg/dL — ABNORMAL HIGH (ref 70–99)
POTASSIUM: 4.5 meq/L (ref 3.7–5.3)
Sodium: 138 mEq/L (ref 137–147)

## 2014-05-05 LAB — CBC
HEMATOCRIT: 43 % (ref 39.0–52.0)
HEMOGLOBIN: 14 g/dL (ref 13.0–17.0)
MCH: 28.7 pg (ref 26.0–34.0)
MCHC: 32.6 g/dL (ref 30.0–36.0)
MCV: 88.1 fL (ref 78.0–100.0)
Platelets: 308 10*3/uL (ref 150–400)
RBC: 4.88 MIL/uL (ref 4.22–5.81)
RDW: 13.1 % (ref 11.5–15.5)
WBC: 7.6 10*3/uL (ref 4.0–10.5)

## 2014-05-05 MED ORDER — NITROGLYCERIN 0.4 MG SL SUBL: 0.4000 mg | SUBLINGUAL_TABLET | SUBLINGUAL | Status: DC | PRN

## 2014-05-05 MED ORDER — TECHNETIUM TC 99M SESTAMIBI GENERIC - CARDIOLITE
30.0000 | Freq: Once | INTRAVENOUS | Status: AC | PRN
  Administered 2014-05-05: 30 via INTRAVENOUS

## 2014-05-05 MED ORDER — ASPIRIN 81 MG PO TBEC: 81.0000 mg | DELAYED_RELEASE_TABLET | Freq: Every day | ORAL | Status: DC

## 2014-05-05 NOTE — Progress Notes (Signed)
FMTS Attending Note Patient seen and examined by me, discussed with Dr Beryle FlockBacigalupo and I agree with her assessment and plan for today. Patient is reporting complete resolution of his symptoms, no chest pain/pressure and no dyspnea.   Awaiting resting views today and cardiac recommendations.  Given the complete resolution of symptoms, not actively concerned for PE. I would recommend him to discontinue use of phentermine in light of this episode. Paula ComptonJames Prentice Sackrider, MD

## 2014-05-05 NOTE — Progress Notes (Signed)
Patient: Edwin Cox / Admit Date: 05/03/2014 / Date of Encounter: 05/05/2014, 10:16 AM   Subjective: Feeling great. No CP or SOB.   Objective: Telemetry: NSR/ST Physical Exam: Blood pressure 120/68, pulse 94, temperature 98.6 F (37 C), temperature source Oral, resp. rate 20, height 6' (1.829 m), weight 362 lb 3.5 oz (164.302 kg), SpO2 94.00%. General: Well developed, obese wm in no acute distress. Head: Normocephalic, atraumatic, sclera non-icteric, no xanthomas, nares are without discharge. Neck: Negative for carotid bruits. JVP not elevated. Lungs: Clear bilaterally to auscultation without wheezes, rales, or rhonchi. Breathing is unlabored. Heart: RRR borderline tachycardic S1 S2 without murmurs, rubs, or gallops.  Abdomen: Soft, non-tender, non-distended with normoactive bowel sounds. No rebound/guarding. Extremities: No clubbing or cyanosis. No edema. Distal pedal pulses are 2+ and equal bilaterally. Neuro: Alert and oriented X 3. Moves all extremities spontaneously. Psych:  Responds to questions appropriately with a normal affect.  No intake or output data in the 24 hours ending 05/05/14 1016  Inpatient Medications:  . aspirin EC  81 mg Oral Daily  . enoxaparin (LOVENOX) injection  80 mg Subcutaneous Daily  . lisinopril  5 mg Oral Daily  . pantoprazole  40 mg Oral Q0600   Infusions:    Labs:  Recent Labs  05/03/14 2015 05/04/14 0125 05/05/14 0354  NA 137  --  138  K 3.9  --  4.5  CL 94*  --  99  CO2 27  --  26  GLUCOSE 102*  --  106*  BUN 15  --  15  CREATININE 0.81 0.86 0.74  CALCIUM 10.0  --  9.1   No results found for this basename: AST, ALT, ALKPHOS, BILITOT, PROT, ALBUMIN,  in the last 72 hours  Recent Labs  05/04/14 0125 05/05/14 0354  WBC 10.3 7.6  HGB 15.3 14.0  HCT 46.1 43.0  MCV 87.1 88.1  PLT 363 308    Recent Labs  05/04/14 0125 05/04/14 0335 05/04/14 0700  TROPONINI <0.30 <0.30 <0.30   No components found with this basename:  POCBNP,   Recent Labs  05/04/14 0335  HGBA1C 5.5     Radiology/Studies:  Dg Chest 2 View  05/03/2014   CLINICAL DATA:  Chest pain, shortness of breath  EXAM: CHEST  2 VIEW  COMPARISON:  None.  FINDINGS: Lungs are clear.  No pleural effusion or pneumothorax.  The heart is normal in size.  Mild degenerative changes of the visualized thoracolumbar spine.  IMPRESSION: No evidence of acute cardiopulmonary disease.   Electronically Signed   By: Charline BillsSriyesh  Krishnan M.D.   On: 05/03/2014 21:02     Assessment and Plan  1. Chest pain, constant x 4 hrs with negative enzymes 2. HTN with LVH on echo 3. Morbid obesity Body mass index is 49.12 kg/(m^2). 4. Former tobacco abuse 5. GERD  Enzymes negative despite prolonged pain. 2D echo 05/04/14: mild LVH, EF 60%, no RMWA, mildly dilated LA. Per notes from yesterday, stress portion of 2 day stress/ rest Myoview was not normal. He apparently had an apical defect. Await day 2/2 of nuclear images. He just had this done. [facility] the studies on the weekend. He is not tachypnic, hypoxic or SOB. HR is mildly tachycardic - ? R/t to phentermine. HR as high as 120s-130s on tele at times. He has not formally been r/o for PE but IM feels less likely.  Signed, Maryruth Hancockayna Dunn PA-C  Cardiology Attending  Patient seen and examined. Results of stress test pending.  If low risk then he can be discharged home with followup. He desperately needs lifestyle modification and to stop taking Phenteramine.  Leonia ReevesGregg Sadako Cegielski,M.D.

## 2014-05-05 NOTE — Discharge Instructions (Signed)
You were admitted for chest pain.  We did a stress test and it was found to be normal. Cardiology was consulted and recommends you stop taking Phentermine; we agree. We recommend discussing lifestyle modifications with your primary care physician, if this has not been done yet.  Chest Pain (Nonspecific) It is often hard to give a specific diagnosis for the cause of chest pain. There is always a chance that your pain could be related to something serious, such as a heart attack or a blood clot in the lungs. You need to follow up with your health care provider for further evaluation. CAUSES   Heartburn.  Pneumonia or bronchitis.  Anxiety or stress.  Inflammation around your heart (pericarditis) or lung (pleuritis or pleurisy).  A blood clot in the lung.  A collapsed lung (pneumothorax). It can develop suddenly on its own (spontaneous pneumothorax) or from trauma to the chest.  Shingles infection (herpes zoster virus). The chest wall is composed of bones, muscles, and cartilage. Any of these can be the source of the pain.  The bones can be bruised by injury.  The muscles or cartilage can be strained by coughing or overwork.  The cartilage can be affected by inflammation and become sore (costochondritis). DIAGNOSIS  Lab tests or other studies may be needed to find the cause of your pain. Your health care provider may have you take a test called an ambulatory electrocardiogram (ECG). An ECG records your heartbeat patterns over a 24-hour period. You may also have other tests, such as:  Transthoracic echocardiogram (TTE). During echocardiography, sound waves are used to evaluate how blood flows through your heart.  Transesophageal echocardiogram (TEE).  Cardiac monitoring. This allows your health care provider to monitor your heart rate and rhythm in real time.  Holter monitor. This is a portable device that records your heartbeat and can help diagnose heart arrhythmias. It allows your  health care provider to track your heart activity for several days, if needed.  Stress tests by exercise or by giving medicine that makes the heart beat faster. TREATMENT   Treatment depends on what may be causing your chest pain. Treatment may include:  Acid blockers for heartburn.  Anti-inflammatory medicine.  Pain medicine for inflammatory conditions.  Antibiotics if an infection is present.  You may be advised to change lifestyle habits. This includes stopping smoking and avoiding alcohol, caffeine, and chocolate.  You may be advised to keep your head raised (elevated) when sleeping. This reduces the chance of acid going backward from your stomach into your esophagus. Most of the time, nonspecific chest pain will improve within 2-3 days with rest and mild pain medicine.  HOME CARE INSTRUCTIONS   If antibiotics were prescribed, take them as directed. Finish them even if you start to feel better.  For the next few days, avoid physical activities that bring on chest pain. Continue physical activities as directed.  Do not use any tobacco products, including cigarettes, chewing tobacco, or electronic cigarettes.  Avoid drinking alcohol.  Only take medicine as directed by your health care provider.  Follow your health care provider's suggestions for further testing if your chest pain does not go away.  Keep any follow-up appointments you made. If you do not go to an appointment, you could develop lasting (chronic) problems with pain. If there is any problem keeping an appointment, call to reschedule. SEEK MEDICAL CARE IF:   Your chest pain does not go away, even after treatment.  You have a rash with  blisters on your chest.  You have a fever. SEEK IMMEDIATE MEDICAL CARE IF:   You have increased chest pain or pain that spreads to your arm, neck, jaw, back, or abdomen.  You have shortness of breath.  You have an increasing cough, or you cough up blood.  You have severe  back or abdominal pain.  You feel nauseous or vomit.  You have severe weakness.  You faint.  You have chills. This is an emergency. Do not wait to see if the pain will go away. Get medical help at once. Call your local emergency services (911 in [place] Do not drive yourself to the hospital. MAKE SURE YOU:   Understand these instructions.  Will watch your condition.  Will get help right away if you are not doing well or get worse. Document Released: 04/01/2005 Document Revised: 06/27/2013 Document Reviewed: 01/26/2008 [facility] Patient Information 2015 [employer] [place]. This information is not intended to replace advice given to you by your health care provider. Make sure you discuss any questions you have with your health care provider.

## 2014-05-05 NOTE — Progress Notes (Signed)
UR completed 

## 2014-05-05 NOTE — Progress Notes (Signed)
Family Medicine Teaching Service Daily Progress Note Intern Pager: [phone]  Patient name: Edwin SilversmithJoseph Lehan Medical record number: [id number] Date of birth: 01/10/1971 Age: 43 y.o. Gender: male  Primary Care Provider: No primary provider on file. ([facility] [place] Consultants: cardiology Code Status: FULL  Pt Overview and Major Events to Date:  05/03/14: patient admitted with substernal CP, resolved with SL nitro x2  Assessment and Plan:  Edwin Cox is a 43 y.o. male presenting with substernal chest pain. PMH is significant for HTN, morbid obesity, former smoker, GERD.   #Chest pain: Typical sounding chest pain (substernal pressure, radiation to L arm, diaphoresis, relieved with nitro). RFs for ACS include HTN, morbid obesity and former smoker. No EKG changes, iStat troponin negative. Must also consider GI etiology given h/o GERD (though has been asymptomatic off medications since he quit smoking ~3 yrs ago).  - Troponins negative x3  - Repeat EKG stable with NSR and nonspecific T wave abnormalities - Cardiology following, appreciate recs - needs resting stress test today, apical defect noted on day 1 - Echo - EF 60%, normal wall motion, no diastolic dysfunction - SL Nitro prn, Tylenol prn for pain control  - Aspirin 81mg  daily  - ASCVD risk 1.3% (if put in nonsmoker) - 4.1% (if put in smoker given prior history) - will recommend starting statin at discharge to PCP - A1c 5.5, TSH wnl  # Sinus tachycardia: Resolved. Tachycardic to 130 in ED. Likely related to pain and volume depletion. Improving s/p Nitro and IVF. No signs concerning for PE currently (no dyspnea, O2 requirement, recent travel/surgery/trauma, hemoptysis, calf tenderness).  - Continue to monitor on telemetry   #HTN: Well controlled O/N. - Continue lisinopril 5mg  - Continue to monitor closely  - f/u with PCP about home lisinopril dose as patient is unsure  #Obesity: Patient on phentermine at  home for weight loss.  -As phentermine has undetermined cardiac risk, appreciate cardiology recs on continuing this medication  FEN/GI: NPO for stress test, SLIV  Prophylaxis: Lovenox   Disposition: Pending day 2 of stress test and cards recs  Subjective:  Feeling well this morning. Denies any further CP, SOB.  Objective: Temp:  [97.7 F (36.5 C)-98.6 F (37 C)] 98.6 F (37 C) (10/31 0400) Pulse Rate:  [94-104] 94 (10/31 0400) Resp:  [18-20] 20 (10/31 0400) BP: (120-136)/(63-87) 120/68 mmHg (10/31 0400) SpO2:  [94 %-98 %] 94 % (10/31 0400) Weight:  [362 lb 3.5 oz (164.302 kg)] 362 lb 3.5 oz (164.302 kg) (10/31 0400)  Physical Exam: GEN: NAD, lying in bed, sleeping but easily arousable CV: RRR though distant due to habitus PULM: CTAB, normal effort, no wheezing or crackles ABD: Obese, soft, nontender, nondistended EXTR: No LE edema or calf tenderness CHEST: No reproducibility to chest pain  Laboratory:  Recent Labs Lab 05/03/14 2015 05/04/14 0125 05/05/14 0354  WBC 10.0 10.3 7.6  HGB 15.0 15.3 14.0    Recent Labs Lab 05/03/14 2015 05/04/14 0125 05/05/14 0354  HCT 46.1 46.1 43.0  PLT 343 363 308    Recent Labs Lab 05/03/14 2015 05/04/14 0125 05/05/14 0354  NA 137  --  138  K 3.9  --  4.5  CL 94*  --  99  CO2 27  --  26  BUN 15  --  15  CREATININE 0.81 0.86 0.74  CALCIUM 10.0  --  9.1  GLUCOSE 102*  --  106*    Lipid Panel     Component Value  Date/Time   CHOL 160 05/04/2014 0335   TRIG 203* 05/04/2014 0335   HDL 35* 05/04/2014 0335   CHOLHDL 4.6 05/04/2014 0335   VLDL 41* 05/04/2014 0335   LDLCALC 84 05/04/2014 0335    Hgb A1c 5.5 iStat tropionin 0.02  ProBNP <5 Troponins x3: <0.30  Imaging/Diagnostic Tests: CXR (10/29): No evidence of acute cardiopulmonary disease.  EKG (10/29): Sinus tachycardia, no evidence of ischemia  Echo (10/30): EF 60%, normal wall motion, no diastolic dysfunction   Shirlee LatchAngela Karnell Vanderloop, MD, MPH PGY-1,  [facility] 05/05/2014 9:38 AM  FPTS Intern pager: [phone], text pages welcome

## 2014-05-05 NOTE — Progress Notes (Signed)
Nuc was normal. Have asked nursing to pass this information onto patient and primary team. See prior note. Grove Defina PA-C

## 2014-10-14 ENCOUNTER — Emergency Department (HOSPITAL_COMMUNITY)
Admission: EM | Admit: 2014-10-14 | Discharge: 2014-10-14 | Disposition: A | Discharge status: Home / Self Care | Payer: Managed Care, Other (non HMO) | Attending: Emergency Medicine | Admitting: Emergency Medicine

## 2014-10-14 DIAGNOSIS — E669 Obesity, unspecified: Secondary | ICD-10-CM | POA: Insufficient documentation

## 2014-10-14 DIAGNOSIS — S39012A Strain of muscle, fascia and tendon of lower back, initial encounter: Secondary | ICD-10-CM

## 2014-10-14 DIAGNOSIS — Y9289 Other specified places as the place of occurrence of the external cause: Secondary | ICD-10-CM | POA: Insufficient documentation

## 2014-10-14 DIAGNOSIS — Y998 Other external cause status: Secondary | ICD-10-CM | POA: Insufficient documentation

## 2014-10-14 DIAGNOSIS — S3992XA Unspecified injury of lower back, initial encounter: Secondary | ICD-10-CM | POA: Diagnosis present

## 2014-10-14 DIAGNOSIS — Z7982 Long term (current) use of aspirin: Secondary | ICD-10-CM | POA: Diagnosis not present

## 2014-10-14 DIAGNOSIS — W500XXA Accidental hit or strike by another person, initial encounter: Secondary | ICD-10-CM | POA: Insufficient documentation

## 2014-10-14 DIAGNOSIS — Y9389 Activity, other specified: Secondary | ICD-10-CM | POA: Diagnosis not present

## 2014-10-14 DIAGNOSIS — Z79899 Other long term (current) drug therapy: Secondary | ICD-10-CM | POA: Diagnosis not present

## 2014-10-14 DIAGNOSIS — I10 Essential (primary) hypertension: Secondary | ICD-10-CM | POA: Diagnosis not present

## 2014-10-14 LAB — I-STAT CREATININE, ED: Creatinine, Ser: 0.8 mg/dL (ref 0.50–1.35)

## 2014-10-14 MED ORDER — DIAZEPAM 5 MG/ML IJ SOLN
5.0000 mg | Freq: Once | INTRAMUSCULAR | Status: AC
  Administered 2014-10-14: 5 mg via INTRAVENOUS
  Filled 2014-10-14: qty 2

## 2014-10-14 MED ORDER — METHOCARBAMOL 1000 MG/10ML IJ SOLN: 1000.0000 mg | Freq: Once | INTRAMUSCULAR | Status: DC

## 2014-10-14 MED ORDER — HYDROMORPHONE HCL 1 MG/ML IJ SOLN
0.5000 mg | Freq: Once | INTRAMUSCULAR | Status: AC
Start: 2014-10-14 — End: 2014-10-14
  Administered 2014-10-14: 0.5 mg via INTRAVENOUS
  Filled 2014-10-14: qty 1

## 2014-10-14 MED ORDER — METHYLPREDNISOLONE SODIUM SUCC 125 MG IJ SOLR
125.0000 mg | Freq: Once | INTRAMUSCULAR | Status: AC
  Administered 2014-10-14: 125 mg via INTRAVENOUS
  Filled 2014-10-14: qty 2

## 2014-10-14 MED ORDER — DIAZEPAM 5 MG PO TABS: 5.0000 mg | ORAL_TABLET | Freq: Two times a day (BID) | ORAL | Status: DC

## 2014-10-14 MED ORDER — METHOCARBAMOL 1000 MG/10ML IJ SOLN
1000.0000 mg | Freq: Once | INTRAMUSCULAR | Status: AC
  Administered 2014-10-14: 1000 mg via INTRAVENOUS
  Filled 2014-10-14: qty 10

## 2014-10-14 MED ORDER — KETOROLAC TROMETHAMINE 15 MG/ML IJ SOLN
30.0000 mg | Freq: Once | INTRAMUSCULAR | Status: AC
  Administered 2014-10-14: 30 mg via INTRAVENOUS
  Filled 2014-10-14: qty 2

## 2014-10-14 MED ORDER — SODIUM CHLORIDE 0.9 % IV BOLUS (SEPSIS)
1000.0000 mL | Freq: Once | INTRAVENOUS | Status: AC
  Administered 2014-10-14: 1000 mL via INTRAVENOUS

## 2014-10-14 MED ORDER — OXYCODONE-ACETAMINOPHEN 5-325 MG PO TABS: ORAL_TABLET | ORAL | Status: DC

## 2014-10-14 NOTE — ED Provider Notes (Signed)
CSN: [id number]     Arrival date & time 10/14/14  1421 History   First MD Initiated Contact with Patient 10/14/14 1511     Chief Complaint  Patient presents with  . Back Pain     (Consider location/radiation/quality/duration/timing/severity/associated sxs/prior Treatment) HPI   Bess KindsDaniel Penaflor is a 44 y.o. male complaining of severe right lower back pain, rated at 8 out of 10 at his baseline at 469 out of 10 when he moves. He is taking no pain medication prior to arrival. This was acute in onset, he was pushing his daughter on a swing and hit him suddenly. Patient stayed in his car for 1.5 hours because he couldn't move secondary to pain. He states that the pain radiates down the posterior left leg, patient denies numbness (this contradicts triage note) patient is ambulatory, he has a history of mild low back pain. Patient denies fever, chills, numbness, change in bowel or bladder habit, history of IV drug use, history of cancer.  Past Medical History  Diagnosis Date  . Hypertension   . Obesity    Past Surgical History  Procedure Laterality Date  . Eye surgery     Family History  Problem Relation Age of Onset  . Diabetes Mother   . Coronary artery disease Maternal Grandfather    History  Substance Use Topics  . Smoking status: Never Smoker   . Smokeless tobacco: Not on file  . Alcohol Use: Yes    Review of Systems  10 systems reviewed and found to be negative, except as noted in the HPI.  Allergies  Review of patient's allergies indicates no known allergies.  Home Medications   Prior to Admission medications   Medication Sig Start Date End Date Taking? Authorizing Provider  ibuprofen (ADVIL,MOTRIN) 200 MG tablet Take 400-800 mg by mouth every 6 (six) hours as needed for mild pain.   Yes Historical Provider, MD  lisinopril-hydrochlorothiazide (PRINZIDE,ZESTORETIC) 20-12.5 MG per tablet Take 2 tablets by mouth daily.   Yes Historical Provider, MD  aspirin 81 MG EC tablet  Take 1 tablet (81 mg total) by mouth daily. Patient not taking: Reported on 10/14/2014 05/05/14   Erasmo DownerAngela M Bacigalupo, MD  nitroGLYCERIN (NITROSTAT) 0.4 MG SL tablet Place 1 tablet (0.4 mg total) under the tongue every 5 (five) minutes as needed for chest pain. 05/05/14   Erasmo DownerAngela M Bacigalupo, MD   BP 133/95 mmHg  Pulse 106  Temp(Src) 98.5 F (36.9 C) (Oral)  Resp 16  SpO2 99% Physical Exam  Constitutional: He is oriented to person, place, and time. He appears well-developed and well-nourished. No distress.  Obese  HENT:  Head: Normocephalic.  Mouth/Throat: Oropharynx is clear and moist.  Eyes: Conjunctivae and EOM are normal. Pupils are equal, round, and reactive to light.  Neck: Normal range of motion.  Cardiovascular: Normal rate and intact distal pulses.   Pulmonary/Chest: Effort normal and breath sounds normal. No stridor. No respiratory distress. He has no wheezes. He has no rales. He exhibits no tenderness.  Abdominal: Soft. Bowel sounds are normal. He exhibits no distension and no mass. There is no tenderness. There is no rebound and no guarding.  Musculoskeletal: Normal range of motion.  No midline tenderness palpation along the lumbar spine he is very tender to palpation along the right paraspinal musculature. Straight leg raise against raise is positive at 30. Extensor hallux longus longus is 5 out of 5 bilaterally he is distally neurovascularly intact. No saddle anesthesia.  Neurological: He is alert and  oriented to person, place, and time.  Psychiatric: He has a normal mood and affect.  Nursing note and vitals reviewed.   ED Course  Procedures (including critical care time) Labs Review Labs Reviewed - No data to display  Imaging Review No results found.   EKG Interpretation None      MDM   Final diagnoses:  Lumbar strain, initial encounter     Filed Vitals:   10/14/14 1431 10/14/14 1746  BP: 133/95 128/77  Pulse: 106 106  Temp: 98.5 F (36.9 C)    TempSrc: Oral   Resp: 16 18  SpO2: 99% 97%    Medications  sodium chloride 0.9 % bolus 1,000 mL (not administered)  ketorolac (TORADOL) 15 MG/ML injection 30 mg (not administered)  methocarbamol (ROBAXIN) injection 1,000 mg (not administered)  HYDROmorphone (DILAUDID) injection 0.5 mg (0.5 mg Intravenous Given 10/14/14 1552)  methylPREDNISolone sodium succinate (SOLU-MEDROL) 125 mg/2 mL injection 125 mg (125 mg Intravenous Given 10/14/14 1552)  diazepam (VALIUM) injection 5 mg (5 mg Intravenous Given 10/14/14 1552)    Lionardo Haze is a pleasant 44 y.o. male presenting with severe right lumbar back pain after patient was pushing his daughter on a swing.  back pain.  No neurological deficits and normal neuro exam.  Patient can walk but states is painful.  No loss of bowel or bladder control.  No concern for cauda equina.  No fever, night sweats, weight loss, h/o cancer, IVDU.  RICE protocol and pain medicine indicated and discussed with patient.  Evaluation does not show pathology that would require ongoing emergent intervention or inpatient treatment. Pt is hemodynamically stable and mentating appropriately. Discussed findings and plan with patient/guardian, who agrees with care plan. All questions answered. Return precautions discussed and outpatient follow up given.   New Prescriptions   DIAZEPAM (VALIUM) 5 MG TABLET    Take 1 tablet (5 mg total) by mouth 2 (two) times daily.   OXYCODONE-ACETAMINOPHEN (PERCOCET/ROXICET) 5-325 MG PER TABLET    1 to 2 tabs PO q6hrs  PRN for pain         Wynetta Emery, PA-C 10/14/14 1754  Arby Barrette, MD 10/14/14 2359

## 2014-10-14 NOTE — Discharge Instructions (Signed)
Please take ibuprofen 400mg (this is normally 2 over the counter pills) every 6 hours (take with food to minimze stomach irritation).  ° °Take valium and/or percocet for breakthrough pain, do not drink alcohol, drive, care for children or perfom other critical tasks while taking valium and/or percocet. ° °Please follow with your primary care doctor in the next 2 days for a check-up. They must obtain records for further management.  ° °Do not hesitate to return to the Emergency Department for any new, worsening or concerning symptoms.  ° ° °Back Pain, Adult °Low back pain is very common. About 1 in 5 people have back pain. The cause of low back pain is rarely dangerous. The pain often gets better over time. About half of people with a sudden onset of back pain feel better in just 2 weeks. About 8 in 10 people feel better by 6 weeks.  °CAUSES °Some common causes of back pain include: °· Strain of the muscles or ligaments supporting the spine. °· Wear and tear (degeneration) of the spinal discs. °· Arthritis. °· Direct injury to the back. °DIAGNOSIS °Most of the time, the direct cause of low back pain is not known. However, back pain can be treated effectively even when the exact cause of the pain is unknown. Answering your caregiver's questions about your overall health and symptoms is one of the most accurate ways to make sure the cause of your pain is not dangerous. If your caregiver needs more information, he or she may order lab work or imaging tests (X-rays or MRIs). However, even if imaging tests show changes in your back, this usually does not require surgery. °HOME CARE INSTRUCTIONS °For many people, back pain returns. Since low back pain is rarely dangerous, it is often a condition that people can learn to manage on their own.  °· Remain active. It is stressful on the back to sit or stand in one place. Do not sit, drive, or stand in one place for more than 30 minutes at a time. Take short walks on level  surfaces as soon as pain allows. Try to increase the length of time you walk each day. °· Do not stay in bed. Resting more than 1 or 2 days can delay your recovery. °· Do not avoid exercise or work. Your body is made to move. It is not dangerous to be active, even though your back may hurt. Your back will likely heal faster if you return to being active before your pain is gone. °· Pay attention to your body when you  bend and lift. Many people have less discomfort when lifting if they bend their knees, keep the load close to their bodies, and avoid twisting. Often, the most comfortable positions are those that put less stress on your recovering back. °· Find a comfortable position to sleep. Use a firm mattress and lie on your side with your knees slightly bent. If you lie on your back, put a pillow under your knees. °· Only take over-the-counter or prescription medicines as directed by your caregiver. Over-the-counter medicines to reduce pain and inflammation are often the most helpful. Your caregiver may prescribe muscle relaxant drugs. These medicines help dull your pain so you can more quickly return to your normal activities and healthy exercise. °· Put ice on the injured area. °¨ Put ice in a plastic bag. °¨ Place a towel between your skin and the bag. °¨ Leave the ice on for 15-20 minutes, 03-04 times a day for the first 2 to 3 days. After that, ice and heat may be alternated to reduce pain   and spasms. °· Ask your caregiver about trying back exercises and gentle massage. This may be of some benefit. °· Avoid feeling anxious or stressed. Stress increases muscle tension and can worsen back pain. It is important to recognize when you are anxious or stressed and learn ways to manage it. Exercise is a great option. °SEEK MEDICAL CARE IF: °· You have pain that is not relieved with rest or medicine. °· You have pain that does not improve in 1 week. °· You have new symptoms. °· You are generally not feeling  well. °SEEK IMMEDIATE MEDICAL CARE IF:  °· You have pain that radiates from your back into your legs. °· You develop new bowel or bladder control problems. °· You have unusual weakness or numbness in your arms or legs. °· You develop nausea or vomiting. °· You develop abdominal pain. °· You feel faint. °Document Released: 06/22/2005 Document Revised: 12/22/2011 Document Reviewed: 10/24/2013 °ExitCare® Patient Information ©2015 [employer]. This information is not intended to replace advice given to you by your health care provider. Make sure you discuss any questions you have with your health care provider. ° ° °

## 2014-10-14 NOTE — ED Notes (Signed)
Patient ambulated with no assistance.  

## 2014-10-14 NOTE — ED Notes (Signed)
Pt reports he was pushing his daughter on a swing and felt an immediate pain in lower left back. Sat in his car for 1.5 hours because he was unable to move. Reports left leg slightly numb. Pain 8/10. Ambulatory with pain.

## 2015-04-04 ENCOUNTER — Other Ambulatory Visit: Payer: Self-pay | Admitting: General Surgery

## 2015-05-29 ENCOUNTER — Encounter: Payer: Self-pay | Admitting: Dietician

## 2015-05-29 ENCOUNTER — Encounter: Payer: Managed Care, Other (non HMO) | Attending: General Surgery | Admitting: Dietician

## 2015-05-29 DIAGNOSIS — Z713 Dietary counseling and surveillance: Secondary | ICD-10-CM | POA: Diagnosis not present

## 2015-05-29 DIAGNOSIS — Z6841 Body Mass Index (BMI) 40.0 and over, adult: Secondary | ICD-10-CM | POA: Diagnosis not present

## 2015-05-29 NOTE — Progress Notes (Signed)
  Pre-Op Assessment Visit:  Pre-Operative Sleeve Gastrectomy Surgery  Medical Nutrition Therapy:  Appt start time: 1015   End time:  1055.  Patient was seen on 05/29/2015 for Pre-Operative Nutrition Assessment. [facility] on 05/29/2015.   Preferred Learning Style:   No preference indicated   Learning Readiness:   Ready  Handouts given during visit include:  Pre-Op Goals Bariatric Surgery Protein Shakes   During the appointment today the following Pre-Op Goals were reviewed with the patient: Maintain or lose weight as instructed by your surgeon Make healthy food choices Begin to limit portion sizes Limited concentrated sugars and fried foods Keep fat/sugar in the single digits per serving on   food labels Practice CHEWING your food  (aim for 30 chews per bite or until applesauce consistency) Practice not drinking 15 minutes before, during, and 30 minutes after each meal/snack Avoid all carbonated beverages  Avoid/limit caffeinated beverages  Avoid all sugar-sweetened beverages Consume 3 meals per day; eat every 3-5 hours Make a list of non-food related activities Aim for 64-100 ounces of FLUID daily  Aim for at least 60-80 grams of PROTEIN daily Look for a liquid protein source that contain ?15 g protein and ?5 g carbohydrate  (ex: shakes, drinks, shots)  Patient-Centered Goals: Goals: Would like to be there for his daughter in the future  6 level confidence/8-9 level of importance scale   Demonstrated degree of understanding via:  Teach Back  Teaching Method Utilized:  Visual Auditory Hands on  Barriers to learning/adherence to lifestyle change: none  [facility] to call the [facility] to enroll in Pre-Op and Post-Op Nutrition Education when surgery date is scheduled.

## 2015-05-29 NOTE — Patient Instructions (Signed)

## 2015-06-26 ENCOUNTER — Encounter: Payer: Managed Care, Other (non HMO) | Attending: General Surgery | Admitting: Dietician

## 2015-06-26 ENCOUNTER — Encounter: Payer: Self-pay | Admitting: Dietician

## 2015-06-26 DIAGNOSIS — Z6841 Body Mass Index (BMI) 40.0 and over, adult: Secondary | ICD-10-CM | POA: Insufficient documentation

## 2015-06-26 DIAGNOSIS — Z713 Dietary counseling and surveillance: Secondary | ICD-10-CM | POA: Insufficient documentation

## 2015-06-26 NOTE — Progress Notes (Signed)
  3 Months Supervised Weight Loss Visit:   Pre-Operative Sleeve Gastrectomy Surgery  Medical Nutrition Therapy:  Appt start time: 1150 end time:  1205.  Primary concerns today: Supervised Weight Loss Visit. Returns with a 7 lb weight gain. Has been chewing well, drinking more water, and eating about 4 smaller meals per day. Eating less than he used to but still has the largest meal after working second shift. Having a lean protein and vegetables and keeping carbs low. Hasn't tried protein shakes yet. Has cut out most sweet drinks. Doing some walking at work but feels like he needs to add more exercise.   Weight: 391.1 lbs BMI: 56.1  Patient-Centered Goals: Goals: Would like to be there for his daughter in the future  6 level confidence/8-9 level of importance scale   Preferred Learning Style:   No preference indicated   Learning Readiness:   Ready  Progress Towards Goal(s):  In progress.  Handouts given during visit include:  none   Nutritional Diagnosis:  [place]-3.3 Obesity related to past poor dietary habits and physical inactivity as evidenced by patient attending supervised weight loss for insurance approval of bariatric surgery.    Intervention:  Nutrition counseling provided. Plan: Try Diet Cranberry Juice or Diet V8 Splash if you want juice. Plan to go to [employer] and walk on treadmill for 30 minutes 2-3 x week and increase when you can.  Plan to have a snack in the evening at work so you're not as hungry after work.  Keep working on chewing well. Try to take 20 minutes to eat meals without distractions (without TV or computer). Keep working on not drinking during meals.   Teaching Method Utilized:  Visual Auditory Hands on  Barriers to learning/adherence to lifestyle change: none  Demonstrated degree of understanding via:  Teach Back   Monitoring/Evaluation:  Dietary intake, exercise, and body weight. Follow up in 1 months for 3 month supervised weight loss  visit.

## 2015-06-26 NOTE — Patient Instructions (Addendum)
Try Diet Cranberry Juice or Diet V8 Splash if you want juice. Plan to go to [employer] and walk on treadmill for 30 minutes 2-3 x week and increase when you can.  Plan to have a snack in the evening at work so you're not as hungry after work.  Keep working on chewing well. Try to take 20 minutes to eat meals without distractions (without TV or computer). Keep working on not drinking during meals.

## 2015-07-25 ENCOUNTER — Encounter: Payer: Managed Care, Other (non HMO) | Attending: General Surgery | Admitting: Skilled Nursing Facility1

## 2015-07-25 ENCOUNTER — Encounter: Payer: Self-pay | Admitting: Skilled Nursing Facility1

## 2015-07-25 DIAGNOSIS — Z6841 Body Mass Index (BMI) 40.0 and over, adult: Secondary | ICD-10-CM | POA: Diagnosis not present

## 2015-07-25 DIAGNOSIS — Z713 Dietary counseling and surveillance: Secondary | ICD-10-CM | POA: Diagnosis not present

## 2015-07-25 NOTE — Progress Notes (Signed)
  3 Months Supervised Weight Loss Visit:   Pre-Operative Sleeve Gastrectomy Surgery  Medical Nutrition Therapy:  Appt start time: 1150 end time:  1205.  Primary concerns today: Supervised Weight Loss Visit. Returns with a 11 lb weight gain. Has been chewing well, drinking more water, and eating about 4 smaller meals per day. Eating less than he used to but still has the largest meal after working second shift. Having a lean protein and vegetables and keeping carbs low. Hasn't tried protein shakes yet. Has cut out most sweet drinks just one diet coke a day. Doing some walking at work but feels like he needs to add more exercise. Pt states he has joined a gym but has not gone yet because he slipped on some ice about 3 weeks ago and now has a slipped disk.Pt states he has been Drinking the orangeade juice to avoid soda. Pt was hard on himself for not cutting out the diet coke completely and making more changes.  Pt states he is concerned for his daughter because she is not physically active and is a "thicker girl".  Weight: 402 lbs BMI: 57.8  Patient-Centered Goals: Goals: Would like to be there for his daughter in the future  6 level confidence/8-9 level of importance scale   Preferred Learning Style:   No preference indicated   Learning Readiness:   Ready  Progress Towards Goal(s):  In progress.  Handouts given during visit include:  none   Nutritional Diagnosis:  [place]-3.3 Obesity related to past poor dietary habits and physical inactivity as evidenced by patient attending supervised weight loss for insurance approval of bariatric surgery.    Intervention:  Nutrition counseling provided. Plan: Try Diet Cranberry Juice or Diet V8 Splash if you want juice. Plan to go to [employer] and walk on treadmill as tolerated Plan to have a snack in the evening at work so you're not as hungry after work.  Try your protein shake for you after work meal Keep working on chewing well. Try to  take 20 minutes to eat meals without distractions (without TV or computer). Keep working on not drinking during meals.    Teaching Method Utilized:  Visual Auditory Hands on  Barriers to learning/adherence to lifestyle change: none  Demonstrated degree of understanding via:  Teach Back   Monitoring/Evaluation:  Dietary intake, exercise, and body weight. Follow up in 1 months for 3 month supervised weight loss visit.

## 2015-08-20 ENCOUNTER — Encounter: Payer: Self-pay | Admitting: Dietician

## 2015-08-20 ENCOUNTER — Encounter: Payer: Managed Care, Other (non HMO) | Attending: General Surgery | Admitting: Dietician

## 2015-08-20 DIAGNOSIS — Z6841 Body Mass Index (BMI) 40.0 and over, adult: Secondary | ICD-10-CM | POA: Diagnosis not present

## 2015-08-20 DIAGNOSIS — Z713 Dietary counseling and surveillance: Secondary | ICD-10-CM | POA: Diagnosis not present

## 2015-08-20 NOTE — Progress Notes (Signed)
  3 Months Supervised Weight Loss Visit:   Pre-Operative Sleeve Gastrectomy Surgery  Medical Nutrition Therapy:  Appt start time: 1155 end time:  1210  Primary concerns today: Supervised Weight Loss Visit #3. Returns with a 2 lb weight gain. He injured his back and has not been able to go to the gym. Has cut down to 1 soft drink per day from 3-4. Working on chewing thoroughly. He is struggling to give up on starchy foods. Has given up fried food and desserts. He recognizes that he needs to work on eating more throughout the day. He is excited about surgery and excited to have a tool to help him manage his weight. He is looking forward to life revolving around activities besides food.   Weight: 404.6 lbs BMI:   Patient-Centered Goals: Goals: Would like to be there for his daughter in the future  6 level confidence/8-9 level of importance scale   Preferred Learning Style:   No preference indicated   Learning Readiness:   Ready  Progress Towards Goal(s):  In progress.  Handouts given during visit include:  none   Nutritional Diagnosis:  [place]-3.3 Obesity related to past poor dietary habits and physical inactivity as evidenced by patient attending supervised weight loss for insurance approval of bariatric surgery.    Intervention:  Nutrition counseling provided. Plan: Try Diet Cranberry Juice or Diet V8 Splash if you want juice. Plan to go to [employer] and walk on treadmill as tolerated Plan to have a snack in the evening at work so you're not as hungry after work.  Try your protein shake for you after work meal Keep working on chewing well. Try to take 20 minutes to eat meals without distractions (without TV or computer). Keep working on not drinking during meals.    Teaching Method Utilized:  Visual Auditory Hands on  Barriers to learning/adherence to lifestyle change: none  Demonstrated degree of understanding via:  Teach Back   Monitoring/Evaluation:  Dietary  intake, exercise, and body weight. Follow up in 1 months for 3 month supervised weight loss visit.

## 2015-09-23 ENCOUNTER — Other Ambulatory Visit: Payer: Self-pay

## 2015-09-23 ENCOUNTER — Ambulatory Visit (HOSPITAL_COMMUNITY)
Admission: RE | Admit: 2015-09-23 | Discharge: 2015-09-23 | Disposition: A | Discharge status: Home / Self Care | Payer: Managed Care, Other (non HMO) | Source: Ambulatory Visit | Attending: General Surgery | Admitting: General Surgery

## 2015-09-23 DIAGNOSIS — K224 Dyskinesia of esophagus: Secondary | ICD-10-CM | POA: Diagnosis not present

## 2015-10-24 ENCOUNTER — Ambulatory Visit (INDEPENDENT_AMBULATORY_CARE_PROVIDER_SITE_OTHER): Payer: Managed Care, Other (non HMO) | Admitting: Psychiatry

## 2015-11-07 ENCOUNTER — Ambulatory Visit (INDEPENDENT_AMBULATORY_CARE_PROVIDER_SITE_OTHER): Payer: Managed Care, Other (non HMO) | Admitting: Psychiatry

## 2015-11-25 ENCOUNTER — Encounter: Payer: Managed Care, Other (non HMO) | Attending: General Surgery

## 2015-11-25 DIAGNOSIS — Z029 Encounter for administrative examinations, unspecified: Secondary | ICD-10-CM | POA: Diagnosis present

## 2015-11-26 NOTE — Progress Notes (Signed)
  Pre-Operative Nutrition Class:  Appt start time: 0938   End time:  1830.  Patient was seen on 11/25/2015 for Pre-Operative Bariatric Surgery Education at [facility].   Surgery date:  Surgery type: Sleeve gastrectomy Start weight at [facility]: 384.5 lbs on 05/29/2015 Weight today: 399 lbs  TANITA  BODY COMP RESULTS  11/25/15   BMI (kg/m^2) 57.2   Fat Mass (lbs) 266.6   Fat Free Mass (lbs) 132.4   Total Body Water (lbs) N/A   Samples given per MNT protocol. Patient educated on appropriate usage: Premier protein shake (vanilla - qty 1) Lot #: [id number] Exp: 09/2016  Celebrate Vitamins Multivitamin (orange - qty 1) Lot #: [id number] Exp: 06/2016  Bariatric Advantage Calcium Citrate chew (tropical orange - qty 1) Lot #: [id number] Exp: 02/2016  Unjury Protein Powder (strawberry - qty 1) Lot #: [id number] Exp: 01/2017  The following the learning objectives were met by the patient during this course:  Identify Pre-Op Dietary Goals and will begin 2 weeks pre-operatively  Identify appropriate sources of fluids and proteins   State protein recommendations and appropriate sources pre and post-operatively  Identify Post-Operative Dietary Goals and will follow for 2 weeks post-operatively  Identify appropriate multivitamin and calcium sources  Describe the need for physical activity post-operatively and will follow MD recommendations  State when to call healthcare provider regarding medication questions or post-operative complications  Handouts given during class include:  Pre-Op Bariatric Surgery Diet Handout  Protein Shake Handout  Post-Op Bariatric Surgery Nutrition Handout  [facility] Program Information Flyer  Support Group Information Flyer  [facility] Bariatric Supplements Price List  Follow-Up Plan: Patient will follow-up at [facility] 2 weeks post operatively for diet advancement per MD.

## 2015-12-10 ENCOUNTER — Encounter (HOSPITAL_COMMUNITY)
Admission: RE | Admit: 2015-12-10 | Discharge: 2015-12-10 | Disposition: A | Discharge status: Home / Self Care | Payer: Managed Care, Other (non HMO) | Source: Ambulatory Visit | Attending: General Surgery | Admitting: General Surgery

## 2015-12-10 ENCOUNTER — Encounter (HOSPITAL_COMMUNITY): Payer: Self-pay

## 2015-12-10 DIAGNOSIS — Z6841 Body Mass Index (BMI) 40.0 and over, adult: Secondary | ICD-10-CM | POA: Insufficient documentation

## 2015-12-10 DIAGNOSIS — Z01812 Encounter for preprocedural laboratory examination: Secondary | ICD-10-CM | POA: Diagnosis present

## 2015-12-10 HISTORY — DX: Gastro-esophageal reflux disease without esophagitis: K21.9

## 2015-12-10 HISTORY — DX: Type 2 diabetes mellitus without complications: E11.9

## 2015-12-10 LAB — CBC
HEMATOCRIT: 45.3 % (ref 39.0–52.0)
HEMOGLOBIN: 14.5 g/dL (ref 13.0–17.0)
MCH: 28.3 pg (ref 26.0–34.0)
MCHC: 32 g/dL (ref 30.0–36.0)
MCV: 88.3 fL (ref 78.0–100.0)
Platelets: 308 10*3/uL (ref 150–400)
RBC: 5.13 MIL/uL (ref 4.22–5.81)
RDW: 13.4 % (ref 11.5–15.5)
WBC: 8.2 10*3/uL (ref 4.0–10.5)

## 2015-12-10 LAB — COMPREHENSIVE METABOLIC PANEL
ALBUMIN: 4.7 g/dL (ref 3.5–5.0)
ALT: 23 U/L (ref 17–63)
ANION GAP: 8 (ref 5–15)
AST: 20 U/L (ref 15–41)
Alkaline Phosphatase: 71 U/L (ref 38–126)
BUN: 16 mg/dL (ref 6–20)
CO2: 30 mmol/L (ref 22–32)
Calcium: 9.6 mg/dL (ref 8.9–10.3)
Chloride: 98 mmol/L — ABNORMAL LOW (ref 101–111)
Creatinine, Ser: 0.79 mg/dL (ref 0.61–1.24)
GFR calc Af Amer: >60 mL/min (ref >60)
GFR calc non Af Amer: >60 mL/min (ref >60)
GLUCOSE: 105 mg/dL — AB (ref 65–99)
POTASSIUM: 4.4 mmol/L (ref 3.5–5.1)
SODIUM: 136 mmol/L (ref 135–145)
TOTAL PROTEIN: 7.8 g/dL (ref 6.5–8.1)
Total Bilirubin: 0.7 mg/dL (ref 0.3–1.2)

## 2015-12-10 NOTE — Progress Notes (Signed)
EKG and CXR- 09/2015- EPIC

## 2015-12-10 NOTE — Patient Instructions (Signed)
Edwin KindsDaniel Beck  12/10/2015   Your procedure is scheduled on: 12/23/2015    Report to [facility]  Entrance take [place]  elevators to 3rd floor to  Short Stay Center at    0515 AM.  Call this number if you have problems the morning of surgery [phone]   Remember: ONLY 1 PERSON MAY GO WITH YOU TO SHORT STAY TO GET  READY MORNING OF YOUR SURGERY.  Do not eat food or drink liquids :After Midnight.     Take these medicines the morning of surgery with A SIP OF WATER: oxycodone if needed                                 You may not have any metal on your body including hair pins and              piercings  Do not wear jewelry, , lotions, powders or perfumes, deodorant                        Men may shave face and neck.   Do not bring valuables to the hospital.  IS NOT             RESPONSIBLE   FOR VALUABLES.  Contacts, dentures or bridgework may not be worn into surgery.  Leave suitcase in the car. After surgery it may be brought to your room.       Special Instructions: coughing and deep breathing exercises,leg exercises               Please read over the following fact sheets you were given: _____________________________________________________________________             [facility] - Preparing for Surgery Before surgery, you can play an important role.  Because skin is not sterile, your skin needs to be as free of germs as possible.  You can reduce the number of germs on your skin by washing with CHG (chlorahexidine gluconate) soap before surgery.  CHG is an antiseptic cleaner which kills germs and bonds with the skin to continue killing germs even after washing. Please DO NOT use if you have an allergy to CHG or antibacterial soaps.  If your skin becomes reddened/irritated stop using the CHG and inform your nurse when you arrive at Short Stay. Do not shave (including legs and underarms) for at least 48 hours prior to the first CHG shower.   You may shave your face/neck. Please follow these instructions carefully:  1.  Shower with CHG Soap the night before surgery and the  morning of Surgery.  2.  If you choose to wash your hair, wash your hair first as usual with your  normal  shampoo.  3.  After you shampoo, rinse your hair and body thoroughly to remove the  shampoo.                           4.  Use CHG as you would any other liquid soap.  You can apply chg directly  to the skin and wash                       Gently with a scrungie or clean washcloth.  5.  Apply the CHG  Soap to your body ONLY FROM THE NECK DOWN.   Do not use on face/ open                           Wound or open sores. Avoid contact with eyes, ears mouth and genitals (private parts).                       Wash face,  Genitals (private parts) with your normal soap.             6.  Wash thoroughly, paying special attention to the area where your surgery  will be performed.  7.  Thoroughly rinse your body with warm water from the neck down.  8.  DO NOT shower/wash with your normal soap after using and rinsing off  the CHG Soap.                9.  Pat yourself dry with a clean towel.            10.  Wear clean pajamas.            11.  Place clean sheets on your bed the night of your first shower and do not  sleep with pets. Day of Surgery : Do not apply any lotions/deodorants the morning of surgery.  Please wear clean clothes to the hospital/surgery center.  FAILURE TO FOLLOW THESE INSTRUCTIONS MAY RESULT IN THE CANCELLATION OF YOUR SURGERY PATIENT SIGNATURE_________________________________  NURSE SIGNATURE__________________________________  ________________________________________________________________________

## 2015-12-11 LAB — HEMOGLOBIN A1C
HEMOGLOBIN A1C: 5.7 % — AB (ref 4.8–5.6)
Mean Plasma Glucose: 117 mg/dL

## 2015-12-12 ENCOUNTER — Ambulatory Visit: Payer: Self-pay | Admitting: General Surgery

## 2015-12-12 NOTE — H&P (Signed)
Edwin Cox 12/12/2015 1:50 PM Location: [facility] Patient #: [id number] DOB: 11/09/1970 Divorced / Language: English / Race: White Male  History of Present Illness (Reg Bircher M. Prajna Vanderpool MD; 12/12/2015 2:08 PM) The patient is a 45 year old male who presents for a pre-op visit. He comes in today for his preoperative appointment he has been approved for laparoscopic sleeve gastrectomy for June 19. His initial visit was September 29. His weight at that time was 375 pounds. He denies any medical changes since he was initially seen. He did hurt his back but it has resolved. He denies any chest pain, chest pressure, shortness of breath, orthopnea, paroxysmal nocturnal dyspnea, TIAs or amaurosis fugax. He has occasional reflux and takes occasional over-the-counter reflux medication. His upper GI showed no hiatal hernia and no significant reflux. His EKG and chest x-ray were within normal limits. His initial bariatric evaluation labs in March 2017 were unremarkable except for hemoglobin A1c of 5.9, total cholesterol level of 199, triglyceride level of 202. He has started his preoperative diet   Problem List/Past Medical (Christy Ehrsam M Macguire Holsinger, MD; 12/12/2015 2:10 PM) MORBID OBESITY WITH BMI OF 50.0-59.9, ADULT (E66.01) PREDIABETES (R73.03) HYPERTRIGLYCERIDEMIA (E78.1)  Other Problems (Troy Kanouse M Nan Maya, MD; 12/12/2015 2:10 PM) BACK PAIN, LUMBOSACRAL (M54.5) ESSENTIAL HYPERTENSION (I10) GASTROESOPHAGEAL REFLUX DISEASE, ESOPHAGITIS PRESENCE NOT SPECIFIED (K21.9)  Past Surgical History (Ariyonna Twichell M Kullen Tomasetti, MD; 12/12/2015 2:10 PM) No pertinent past surgical history  Diagnostic Studies History (Emilyann Banka M Laron Angelini, MD; 12/12/2015 2:10 PM) Colonoscopy never  Allergies (Ashley Beck, CMA; 12/12/2015 1:52 PM) No Known Drug Allergies 04/04/2015  Medication History (Irina Okelly M Raimi Guillermo, MD; 12/12/2015 2:10 PM) Lisinopril-Hydrochlorothiazide (20-12.5MG Tablet, Oral) Active. Medications Reconciled OxyCODONE HCl  (5MG/5ML Solution, 5-10 Milliliter Oral every four hours, as needed, Taken starting 12/12/2015) Active. Protonix (40MG Tablet DR, 1 (one) Tablet Oral daily, Taken starting 12/12/2015) Active. Zofran ODT (4MG Tablet Disperse, 1 (one) Tablet Disperse Oral every six hours, as needed, Taken starting 12/12/2015) Active.  Social History (Kaulin Chaves M Tatanisha Cuthbert, MD; 12/12/2015 2:10 PM) Tobacco use Former smoker. No drug use Caffeine use Tea. Alcohol use Occasional alcohol use.  Family History (Bennet Kujawa M Alec Jaros, MD; 12/12/2015 2:10 PM) Hypertension Father, Mother. Prostate Cancer Father.     Review of Systems (Bren Steers M. Fabienne Nolasco MD; 12/12/2015 2:07 PM) General Present- Weight Gain. Not Present- Appetite Loss, Chills, Fatigue, Fever, Night Sweats and Weight Loss. Skin Not Present- Change in Wart/Mole, Dryness, Hives, Jaundice, New Lesions, Non-Healing Wounds, Rash and Ulcer. HEENT Present- Wears glasses/contact lenses. Not Present- Earache, Hearing Loss, Hoarseness, Nose Bleed, Oral Ulcers, Ringing in the Ears, Seasonal Allergies, Sinus Pain, Sore Throat, Visual Disturbances and Yellow Eyes. Respiratory Present- Snoring. Not Present- Bloody sputum, Chronic Cough, Difficulty Breathing and Wheezing. Cardiovascular Not Present- Chest Pain, Difficulty Breathing Lying Down, Leg Cramps, Palpitations, Rapid Heart Rate, Shortness of Breath and Swelling of Extremities. Gastrointestinal Not Present- Abdominal Pain, Bloating, Bloody Stool, Change in Bowel Habits, Chronic diarrhea, Constipation, Difficulty Swallowing, Excessive gas, Gets full quickly at meals, Hemorrhoids, Indigestion, Nausea, Rectal Pain and Vomiting. Male Genitourinary Not Present- Blood in Urine, Change in Urinary Stream, Frequency, Impotence, Nocturia, Painful Urination, Urgency and Urine Leakage. Musculoskeletal Present- Back Pain. Not Present- Joint Pain, Joint Stiffness, Muscle Pain, Muscle Weakness and Swelling of Extremities. Neurological Present-  Headaches. Not Present- Decreased Memory, Fainting, Numbness, Seizures, Tingling, Tremor, Trouble walking and Weakness. Psychiatric Not Present- Anxiety, Bipolar, Change in Sleep Pattern, Depression, Fearful and Frequent crying. Endocrine Not Present- Cold Intolerance, Excessive Hunger, Hair Changes, Heat Intolerance   and New Diabetes. Hematology Not Present- Easy Bruising, Excessive bleeding, Gland problems, HIV and Persistent Infections.  Vitals Fay Records(Ashley Beck CMA; 12/12/2015 1:52 PM) 12/12/2015 1:52 PM Weight: 398 lb Height: 70in Body Surface Area: 2.8 m Body Mass Index: 57.11 kg/m  Temp.: 35F  Pulse: 100 (Regular)  BP: 140/82 (Sitting, Left Arm, Standard)      Physical Exam Minerva Areola(Arnold Kester M. Cully Luckow MD; 12/12/2015 2:06 PM)  General Mental Status-Alert. General Appearance-Consistent with stated age. Hydration-Well hydrated. Voice-Normal. Note: morbidly obese, central truncal  Head and Neck Head-normocephalic, atraumatic with no lesions or palpable masses. Trachea-midline. Thyroid Gland Characteristics - normal size and consistency.  Eye Eyeball - Bilateral-Extraocular movements intact. Sclera/Conjunctiva - Bilateral-No scleral icterus.  Chest and Lung Exam Chest and lung exam reveals -quiet, even and easy respiratory effort with no use of accessory muscles and on auscultation, normal breath sounds, no adventitious sounds and normal vocal resonance. Inspection Chest Wall - Normal. Back - normal.  Breast - Did not examine.  Cardiovascular Cardiovascular examination reveals -normal heart sounds, regular rate and rhythm with no murmurs and normal pedal pulses bilaterally.  Abdomen Inspection Inspection of the abdomen reveals - No Hernias. Skin - Scar - no surgical scars. Palpation/Percussion Palpation and Percussion of the abdomen reveal - Soft, Non Tender, No Rebound tenderness, No Rigidity (guarding) and No  hepatosplenomegaly. Auscultation Auscultation of the abdomen reveals - Bowel sounds normal.  Peripheral Vascular Upper Extremity Palpation - Pulses bilaterally normal.  Neurologic Neurologic evaluation reveals -alert and oriented x 3 with no impairment of recent or remote memory. Mental Status-Normal.  Neuropsychiatric The patient's mood and affect are described as -normal. Judgment and Insight-insight is appropriate concerning matters relevant to self.  Musculoskeletal Normal Exam - Left-Upper Extremity Strength Normal and Lower Extremity Strength Normal. Normal Exam - Right-Upper Extremity Strength Normal and Lower Extremity Strength Normal.  Lymphatic Head & Neck  General Head & Neck Lymphatics: Bilateral - Description - Normal. Axillary - Did not examine. Femoral & Inguinal - Did not examine.    Assessment & Plan Minerva Areola(Naren Benally M. Cimberly Stoffel MD; 12/12/2015 2:10 PM)  MORBID OBESITY WITH BMI OF 50.0-59.9, ADULT (E66.01) Impression: We reviewed his preoperative workup. We discussed the results of his upper GI, EKG and chest x-ray. We discussed the importance of the preoperative diet plan. We discussed how this is designed to help him lose weight to try to shrink his liver. We did discuss the fact that if his liver is too large that we may have to abort surgery therefore, I stressed the importance of strict adherence to the preoperative diet plan. He was given his prescription for postoperative pain, nausea, and heartburn medications. He was encouraged to call the office if he has any additional questions between now and surgery.  Current Plans Started OxyCODONE HCl 5MG /5ML, 5-10 Milliliter every four hours, as needed, 200 Milliliter, 12/12/2015, No Refill. Started Protonix 40MG , 1 (one) Tablet daily, #30, 30 days starting 12/12/2015, Ref. x3. Started Zofran ODT 4MG , 1 (one) Tablet Disperse every six hours, as needed, #20, 12/12/2015, No Refill. Pt Education -  EMW_preopbariatric BACK PAIN, LUMBOSACRAL (M54.5) Impression: per pcp  ESSENTIAL HYPERTENSION (I10) Impression: per pcp  GASTROESOPHAGEAL REFLUX DISEASE, ESOPHAGITIS PRESENCE NOT SPECIFIED (K21.9) Impression: per pcp  PREDIABETES (R73.03) Impression: We discussed his findings of a hemoglobin A1c of 5.9. We discussed this makes him prediabetic. We discussed the importance of physical activity and good food choices.  HYPERTRIGLYCERIDEMIA (E78.1)  Mary SellaEric M. Andrey CampanileWilson, MD, FACS General, Bariatric, & Minimally Invasive Surgery [facility] [place]  

## 2015-12-23 ENCOUNTER — Inpatient Hospital Stay (HOSPITAL_COMMUNITY): Payer: Managed Care, Other (non HMO) | Admitting: Certified Registered Nurse Anesthetist

## 2015-12-23 ENCOUNTER — Inpatient Hospital Stay (HOSPITAL_COMMUNITY)
Admission: RE | Admit: 2015-12-23 | Discharge: 2015-12-25 | DRG: 621 | Disposition: A | Discharge status: Home / Self Care | Payer: Managed Care, Other (non HMO) | Source: Ambulatory Visit | Attending: General Surgery | Admitting: General Surgery

## 2015-12-23 ENCOUNTER — Encounter (HOSPITAL_COMMUNITY): Payer: Self-pay | Admitting: *Deleted

## 2015-12-23 ENCOUNTER — Encounter (HOSPITAL_COMMUNITY)
Admission: RE | Disposition: A | Discharge status: Home / Self Care | Payer: Self-pay | Source: Ambulatory Visit | Attending: General Surgery

## 2015-12-23 DIAGNOSIS — R7303 Prediabetes: Secondary | ICD-10-CM | POA: Diagnosis present

## 2015-12-23 DIAGNOSIS — M545 Low back pain, unspecified: Secondary | ICD-10-CM | POA: Diagnosis present

## 2015-12-23 DIAGNOSIS — Z87891 Personal history of nicotine dependence: Secondary | ICD-10-CM

## 2015-12-23 DIAGNOSIS — Z6841 Body Mass Index (BMI) 40.0 and over, adult: Secondary | ICD-10-CM | POA: Diagnosis not present

## 2015-12-23 DIAGNOSIS — E781 Pure hyperglyceridemia: Secondary | ICD-10-CM | POA: Diagnosis present

## 2015-12-23 DIAGNOSIS — K210 Gastro-esophageal reflux disease with esophagitis: Secondary | ICD-10-CM | POA: Diagnosis present

## 2015-12-23 DIAGNOSIS — I10 Essential (primary) hypertension: Secondary | ICD-10-CM | POA: Diagnosis present

## 2015-12-23 DIAGNOSIS — Z9884 Bariatric surgery status: Secondary | ICD-10-CM

## 2015-12-23 HISTORY — PX: UPPER GI ENDOSCOPY: SHX6162

## 2015-12-23 HISTORY — PX: LAPAROSCOPIC GASTRIC SLEEVE RESECTION: SHX5895

## 2015-12-23 LAB — COMPREHENSIVE METABOLIC PANEL
ALK PHOS: 49 U/L (ref 38–126)
ALT: 45 U/L (ref 17–63)
ANION GAP: 10 (ref 5–15)
AST: 38 U/L (ref 15–41)
Albumin: 3.8 g/dL (ref 3.5–5.0)
BILIRUBIN TOTAL: 0.6 mg/dL (ref 0.3–1.2)
BUN: 15 mg/dL (ref 6–20)
CALCIUM: 8.5 mg/dL — AB (ref 8.9–10.3)
CO2: 24 mmol/L (ref 22–32)
Chloride: 102 mmol/L (ref 101–111)
Creatinine, Ser: 0.91 mg/dL (ref 0.61–1.24)
GLUCOSE: 156 mg/dL — AB (ref 65–99)
POTASSIUM: 4 mmol/L (ref 3.5–5.1)
Sodium: 136 mmol/L (ref 135–145)
TOTAL PROTEIN: 6.3 g/dL — AB (ref 6.5–8.1)

## 2015-12-23 LAB — CBC WITH DIFFERENTIAL/PLATELET
BASOS PCT: 0 %
Basophils Absolute: 0 10*3/uL (ref 0.0–0.1)
Eosinophils Absolute: 0.1 10*3/uL (ref 0.0–0.7)
Eosinophils Relative: 1 %
HEMATOCRIT: 39.8 % (ref 39.0–52.0)
HEMOGLOBIN: 13.2 g/dL (ref 13.0–17.0)
LYMPHS ABS: 2.4 10*3/uL (ref 0.7–4.0)
LYMPHS PCT: 25 %
MCH: 28.5 pg (ref 26.0–34.0)
MCHC: 33.2 g/dL (ref 30.0–36.0)
MCV: 86 fL (ref 78.0–100.0)
MONO ABS: 0.5 10*3/uL (ref 0.1–1.0)
MONOS PCT: 5 %
NEUTROS ABS: 6.5 10*3/uL (ref 1.7–7.7)
NEUTROS PCT: 69 %
Platelets: 270 10*3/uL (ref 150–400)
RBC: 4.63 MIL/uL (ref 4.22–5.81)
RDW: 13.4 % (ref 11.5–15.5)
WBC: 9.5 10*3/uL (ref 4.0–10.5)

## 2015-12-23 LAB — GLUCOSE, CAPILLARY
GLUCOSE-CAPILLARY: 160 mg/dL — AB (ref 65–99)
Glucose-Capillary: 99 mg/dL (ref 65–99)

## 2015-12-23 SURGERY — GASTRECTOMY, SLEEVE, LAPAROSCOPIC
Anesthesia: General | Site: Abdomen

## 2015-12-23 MED ORDER — PHENYLEPHRINE HCL 10 MG/ML IJ SOLN
INTRAMUSCULAR | Status: DC | PRN
  Administered 2015-12-23: 40 ug via INTRAVENOUS
  Administered 2015-12-23: 80 ug via INTRAVENOUS

## 2015-12-23 MED ORDER — 0.9 % SODIUM CHLORIDE (POUR BTL) OPTIME
TOPICAL | Status: DC | PRN
  Administered 2015-12-23: 1000 mL

## 2015-12-23 MED ORDER — FENTANYL CITRATE (PF) 250 MCG/5ML IJ SOLN
INTRAMUSCULAR | Status: AC
  Filled 2015-12-23: qty 5

## 2015-12-23 MED ORDER — LACTATED RINGERS IV SOLN
INTRAVENOUS | Status: DC | PRN
  Administered 2015-12-23 (×2): via INTRAVENOUS

## 2015-12-23 MED ORDER — PROPOFOL 10 MG/ML IV BOLUS
INTRAVENOUS | Status: DC | PRN
  Administered 2015-12-23: 300 mg via INTRAVENOUS

## 2015-12-23 MED ORDER — FENTANYL CITRATE (PF) 100 MCG/2ML IJ SOLN
INTRAMUSCULAR | Status: DC | PRN
  Administered 2015-12-23: 50 ug via INTRAVENOUS
  Administered 2015-12-23: 100 ug via INTRAVENOUS
  Administered 2015-12-23 (×5): 50 ug via INTRAVENOUS

## 2015-12-23 MED ORDER — SUCCINYLCHOLINE CHLORIDE 20 MG/ML IJ SOLN
INTRAMUSCULAR | Status: DC | PRN
  Administered 2015-12-23: 160 mg via INTRAVENOUS

## 2015-12-23 MED ORDER — ENALAPRILAT 1.25 MG/ML IV SOLN
1.2500 mg | Freq: Four times a day (QID) | INTRAVENOUS | Status: DC | PRN
  Administered 2015-12-24 (×2): 1.25 mg via INTRAVENOUS
  Filled 2015-12-23 (×3): qty 1

## 2015-12-23 MED ORDER — DIPHENHYDRAMINE HCL 50 MG/ML IJ SOLN: 12.5000 mg | Freq: Three times a day (TID) | INTRAMUSCULAR | Status: DC | PRN

## 2015-12-23 MED ORDER — ACETAMINOPHEN 10 MG/ML IV SOLN
1000.0000 mg | Freq: Once | INTRAVENOUS | Status: AC
  Administered 2015-12-23: 1000 mg via INTRAVENOUS

## 2015-12-23 MED ORDER — SODIUM CHLORIDE 0.9 % IJ SOLN
INTRAMUSCULAR | Status: DC | PRN
  Administered 2015-12-23: 50 mL via INTRAVENOUS

## 2015-12-23 MED ORDER — ACETAMINOPHEN 10 MG/ML IV SOLN: 1000.0000 mg | Freq: Four times a day (QID) | INTRAVENOUS | Status: DC

## 2015-12-23 MED ORDER — OXYCODONE HCL 5 MG/5ML PO SOLN
5.0000 mg | ORAL | Status: DC | PRN
  Administered 2015-12-24 (×2): 10 mg via ORAL
  Filled 2015-12-23: qty 10
  Filled 2015-12-23: qty 50
  Filled 2015-12-23: qty 10

## 2015-12-23 MED ORDER — FENTANYL CITRATE (PF) 100 MCG/2ML IJ SOLN
INTRAMUSCULAR | Status: AC
  Filled 2015-12-23: qty 2

## 2015-12-23 MED ORDER — MIDAZOLAM HCL 5 MG/5ML IJ SOLN
INTRAMUSCULAR | Status: DC | PRN
  Administered 2015-12-23: 2 mg via INTRAVENOUS

## 2015-12-23 MED ORDER — EPHEDRINE SULFATE 50 MG/ML IJ SOLN
INTRAMUSCULAR | Status: AC
  Filled 2015-12-23: qty 1

## 2015-12-23 MED ORDER — LACTATED RINGERS IR SOLN
Status: DC | PRN
  Administered 2015-12-23: 1000 mL

## 2015-12-23 MED ORDER — DEXAMETHASONE SODIUM PHOSPHATE 10 MG/ML IJ SOLN
INTRAMUSCULAR | Status: AC
  Filled 2015-12-23: qty 1

## 2015-12-23 MED ORDER — SODIUM CHLORIDE 0.9 % IJ SOLN
INTRAMUSCULAR | Status: AC
  Filled 2015-12-23: qty 10

## 2015-12-23 MED ORDER — ONDANSETRON HCL 4 MG/2ML IJ SOLN
4.0000 mg | INTRAMUSCULAR | Status: DC | PRN
  Administered 2015-12-23: 4 mg via INTRAVENOUS
  Filled 2015-12-23: qty 2

## 2015-12-23 MED ORDER — LABETALOL HCL 5 MG/ML IV SOLN
10.0000 mg | INTRAVENOUS | Status: AC | PRN
  Administered 2015-12-23 (×3): 10 mg via INTRAVENOUS
  Filled 2015-12-23: qty 4

## 2015-12-23 MED ORDER — FENTANYL CITRATE (PF) 100 MCG/2ML IJ SOLN
25.0000 ug | INTRAMUSCULAR | Status: DC | PRN
  Administered 2015-12-23: 50 ug via INTRAVENOUS

## 2015-12-23 MED ORDER — MORPHINE SULFATE (PF) 2 MG/ML IV SOLN
2.0000 mg | INTRAVENOUS | Status: DC | PRN
  Administered 2015-12-23: 4 mg via INTRAVENOUS
  Administered 2015-12-23: 2 mg via INTRAVENOUS
  Administered 2015-12-23 – 2015-12-24 (×2): 4 mg via INTRAVENOUS
  Filled 2015-12-23: qty 1
  Filled 2015-12-23 (×3): qty 2

## 2015-12-23 MED ORDER — LABETALOL HCL 5 MG/ML IV SOLN
INTRAVENOUS | Status: AC
  Filled 2015-12-23: qty 4

## 2015-12-23 MED ORDER — FAMOTIDINE IN NACL 20-0.9 MG/50ML-% IV SOLN
20.0000 mg | Freq: Two times a day (BID) | INTRAVENOUS | Status: DC
  Administered 2015-12-23 – 2015-12-25 (×5): 20 mg via INTRAVENOUS
  Filled 2015-12-23 (×5): qty 50

## 2015-12-23 MED ORDER — ONDANSETRON HCL 4 MG/2ML IJ SOLN
INTRAMUSCULAR | Status: AC
  Filled 2015-12-23: qty 2

## 2015-12-23 MED ORDER — LACTATED RINGERS IV SOLN
INTRAVENOUS | Status: DC
  Administered 2015-12-23: 12:00:00 via INTRAVENOUS

## 2015-12-23 MED ORDER — MIDAZOLAM HCL 2 MG/2ML IJ SOLN
INTRAMUSCULAR | Status: AC
  Filled 2015-12-23: qty 2

## 2015-12-23 MED ORDER — ROCURONIUM BROMIDE 100 MG/10ML IV SOLN
INTRAVENOUS | Status: DC | PRN
  Administered 2015-12-23: 10 mg via INTRAVENOUS
  Administered 2015-12-23: 70 mg via INTRAVENOUS
  Administered 2015-12-23: 10 mg via INTRAVENOUS

## 2015-12-23 MED ORDER — SIMETHICONE 40 MG/0.6ML PO SUSP
40.0000 mg | Freq: Four times a day (QID) | ORAL | Status: DC | PRN
  Filled 2015-12-23: qty 0.6

## 2015-12-23 MED ORDER — ROCURONIUM BROMIDE 100 MG/10ML IV SOLN
INTRAVENOUS | Status: AC
  Filled 2015-12-23: qty 1

## 2015-12-23 MED ORDER — MEPERIDINE HCL 50 MG/ML IJ SOLN: 6.2500 mg | INTRAMUSCULAR | Status: DC | PRN

## 2015-12-23 MED ORDER — HYDRALAZINE HCL 20 MG/ML IJ SOLN
INTRAMUSCULAR | Status: AC
  Filled 2015-12-23: qty 1

## 2015-12-23 MED ORDER — ACETAMINOPHEN 10 MG/ML IV SOLN
INTRAVENOUS | Status: AC
  Filled 2015-12-23: qty 100

## 2015-12-23 MED ORDER — KCL IN DEXTROSE-NACL 20-5-0.45 MEQ/L-%-% IV SOLN
INTRAVENOUS | Status: DC
  Administered 2015-12-23 – 2015-12-25 (×6): via INTRAVENOUS
  Filled 2015-12-23 (×8): qty 1000

## 2015-12-23 MED ORDER — SODIUM CHLORIDE 0.9 % IJ SOLN
INTRAMUSCULAR | Status: AC
  Filled 2015-12-23: qty 50

## 2015-12-23 MED ORDER — CHLORHEXIDINE GLUCONATE 0.12 % MT SOLN
15.0000 mL | Freq: Two times a day (BID) | OROMUCOSAL | Status: DC
  Administered 2015-12-23 – 2015-12-24 (×3): 15 mL via OROMUCOSAL
  Filled 2015-12-23 (×8): qty 15

## 2015-12-23 MED ORDER — LACTATED RINGERS IV SOLN: INTRAVENOUS | Status: DC

## 2015-12-23 MED ORDER — METOCLOPRAMIDE HCL 5 MG/ML IJ SOLN
10.0000 mg | Freq: Once | INTRAMUSCULAR | Status: AC | PRN
  Administered 2015-12-23: 10 mg via INTRAVENOUS

## 2015-12-23 MED ORDER — CHLORHEXIDINE GLUCONATE 4 % EX LIQD: 60.0000 mL | Freq: Once | CUTANEOUS | Status: DC

## 2015-12-23 MED ORDER — ACETAMINOPHEN 160 MG/5ML PO SOLN: 650.0000 mg | ORAL | Status: DC | PRN

## 2015-12-23 MED ORDER — PROPOFOL 10 MG/ML IV BOLUS
INTRAVENOUS | Status: AC
  Filled 2015-12-23: qty 40

## 2015-12-23 MED ORDER — ONDANSETRON HCL 4 MG/2ML IJ SOLN
INTRAMUSCULAR | Status: DC | PRN
  Administered 2015-12-23: 4 mg via INTRAVENOUS

## 2015-12-23 MED ORDER — BUPIVACAINE LIPOSOME 1.3 % IJ SUSP
20.0000 mL | Freq: Once | INTRAMUSCULAR | Status: DC
  Filled 2015-12-23: qty 20

## 2015-12-23 MED ORDER — CEFOTETAN DISODIUM-DEXTROSE 2-2.08 GM-% IV SOLR
INTRAVENOUS | Status: AC
  Filled 2015-12-23: qty 50

## 2015-12-23 MED ORDER — LABETALOL HCL 5 MG/ML IV SOLN
20.0000 mg | INTRAVENOUS | Status: AC | PRN
  Administered 2015-12-23 (×2): 20 mg via INTRAVENOUS
  Filled 2015-12-23: qty 4

## 2015-12-23 MED ORDER — LIDOCAINE HCL (CARDIAC) 20 MG/ML IV SOLN
INTRAVENOUS | Status: AC
  Filled 2015-12-23: qty 5

## 2015-12-23 MED ORDER — TISSEEL VH 10 ML EX KIT
PACK | CUTANEOUS | Status: AC
  Filled 2015-12-23: qty 1

## 2015-12-23 MED ORDER — HYDRALAZINE HCL 20 MG/ML IJ SOLN
5.0000 mg | Freq: Once | INTRAMUSCULAR | Status: AC
  Administered 2015-12-23: 5 mg via INTRAVENOUS

## 2015-12-23 MED ORDER — ENOXAPARIN SODIUM 30 MG/0.3ML ~~LOC~~ SOLN
30.0000 mg | Freq: Two times a day (BID) | SUBCUTANEOUS | Status: DC
  Administered 2015-12-24 – 2015-12-25 (×3): 30 mg via SUBCUTANEOUS
  Filled 2015-12-23 (×5): qty 0.3

## 2015-12-23 MED ORDER — METOCLOPRAMIDE HCL 5 MG/ML IJ SOLN
INTRAMUSCULAR | Status: AC
  Filled 2015-12-23: qty 2

## 2015-12-23 MED ORDER — LABETALOL HCL 5 MG/ML IV SOLN
INTRAVENOUS | Status: DC | PRN
  Administered 2015-12-23 (×4): 5 mg via INTRAVENOUS

## 2015-12-23 MED ORDER — SUGAMMADEX SODIUM 500 MG/5ML IV SOLN
INTRAVENOUS | Status: AC
  Filled 2015-12-23: qty 5

## 2015-12-23 MED ORDER — PREMIER PROTEIN SHAKE: 2.0000 [oz_av] | ORAL | Status: DC

## 2015-12-23 MED ORDER — PROMETHAZINE HCL 25 MG/ML IJ SOLN
12.5000 mg | Freq: Four times a day (QID) | INTRAMUSCULAR | Status: DC | PRN
  Filled 2015-12-23: qty 1

## 2015-12-23 MED ORDER — HEPARIN SODIUM (PORCINE) 5000 UNIT/ML IJ SOLN
5000.0000 [IU] | INTRAMUSCULAR | Status: AC
  Administered 2015-12-23: 5000 [IU] via SUBCUTANEOUS
  Filled 2015-12-23: qty 1

## 2015-12-23 MED ORDER — ACETAMINOPHEN 160 MG/5ML PO SOLN: 325.0000 mg | ORAL | Status: DC | PRN

## 2015-12-23 MED ORDER — BUPIVACAINE LIPOSOME 1.3 % IJ SUSP
INTRAMUSCULAR | Status: DC | PRN
  Administered 2015-12-23: 20 mL

## 2015-12-23 MED ORDER — ATROPINE SULFATE 0.4 MG/ML IJ SOLN
INTRAMUSCULAR | Status: AC
  Filled 2015-12-23: qty 1

## 2015-12-23 MED ORDER — SUGAMMADEX SODIUM 500 MG/5ML IV SOLN
INTRAVENOUS | Status: DC | PRN
  Administered 2015-12-23: 350 mg via INTRAVENOUS

## 2015-12-23 MED ORDER — PROCHLORPERAZINE EDISYLATE 5 MG/ML IJ SOLN: 10.0000 mg | Freq: Four times a day (QID) | INTRAMUSCULAR | Status: DC | PRN

## 2015-12-23 MED ORDER — LIDOCAINE HCL (CARDIAC) 20 MG/ML IV SOLN
INTRAVENOUS | Status: DC | PRN
  Administered 2015-12-23: 100 mg via INTRAVENOUS

## 2015-12-23 MED ORDER — DEXAMETHASONE SODIUM PHOSPHATE 10 MG/ML IJ SOLN
INTRAMUSCULAR | Status: DC | PRN
  Administered 2015-12-23: 10 mg via INTRAVENOUS

## 2015-12-23 MED ORDER — CEFOTETAN DISODIUM-DEXTROSE 2-2.08 GM-% IV SOLR
2.0000 g | INTRAVENOUS | Status: AC
  Administered 2015-12-23: 2 g via INTRAVENOUS

## 2015-12-23 MED ORDER — CETYLPYRIDINIUM CHLORIDE 0.05 % MT LIQD
7.0000 mL | Freq: Two times a day (BID) | OROMUCOSAL | Status: DC
  Administered 2015-12-23 – 2015-12-24 (×2): 7 mL via OROMUCOSAL

## 2015-12-23 SURGICAL SUPPLY — 60 items
APPLICATOR COTTON TIP 6IN STRL (MISCELLANEOUS) IMPLANT
APPLIER CLIP ROT 10 11.4 M/L (STAPLE)
BLADE SURG SZ11 CARB STEEL (BLADE) ×3 IMPLANT
CABLE HIGH FREQUENCY MONO STRZ (ELECTRODE) ×3 IMPLANT
CHLORAPREP W/TINT 26ML (MISCELLANEOUS) ×6 IMPLANT
CLIP APPLIE ROT 10 11.4 M/L (STAPLE) IMPLANT
COVER SURGICAL LIGHT HANDLE (MISCELLANEOUS) IMPLANT
DERMABOND ADVANCED (GAUZE/BANDAGES/DRESSINGS) ×1
DERMABOND ADVANCED .7 DNX12 (GAUZE/BANDAGES/DRESSINGS) ×2 IMPLANT
DEVICE SUT QUICK LOAD TK 5 (STAPLE) IMPLANT
DEVICE SUT TI-KNOT TK 5X26 (MISCELLANEOUS) IMPLANT
DEVICE SUTURE ENDOST 10MM (ENDOMECHANICALS) IMPLANT
DEVICE TROCAR PUNCTURE CLOSURE (ENDOMECHANICALS) ×3 IMPLANT
DRAPE UTILITY XL STRL (DRAPES) ×6 IMPLANT
ELECT REM PT RETURN 9FT ADLT (ELECTROSURGICAL) ×3
ELECTRODE REM PT RTRN 9FT ADLT (ELECTROSURGICAL) ×2 IMPLANT
GAUZE SPONGE 4X4 12PLY STRL (GAUZE/BANDAGES/DRESSINGS) IMPLANT
GLOVE BIO SURGEON STRL SZ7.5 (GLOVE) ×3 IMPLANT
GLOVE INDICATOR 8.0 STRL GRN (GLOVE) ×3 IMPLANT
GOWN STRL REUS W/TWL XL LVL3 (GOWN DISPOSABLE) ×15 IMPLANT
HOVERMATT SINGLE USE (MISCELLANEOUS) ×3 IMPLANT
KIT BASIN OR (CUSTOM PROCEDURE TRAY) ×3 IMPLANT
MARKER SKIN DUAL TIP RULER LAB (MISCELLANEOUS) ×3 IMPLANT
NEEDLE SPNL 22GX3.5 QUINCKE BK (NEEDLE) ×3 IMPLANT
PACK UNIVERSAL I (CUSTOM PROCEDURE TRAY) ×3 IMPLANT
RELOAD STAPLER 60MM BLK (STAPLE) ×2 IMPLANT
RELOAD STAPLER BLUE 60MM (STAPLE) ×6 IMPLANT
RELOAD STAPLER GOLD 60MM (STAPLE) ×2 IMPLANT
RELOAD STAPLER GREEN 60MM (STAPLE) ×2 IMPLANT
SCISSORS LAP 5X45 EPIX DISP (ENDOMECHANICALS) ×3 IMPLANT
SEALANT SURGICAL APPL DUAL CAN (MISCELLANEOUS) IMPLANT
SET IRRIG TUBING LAPAROSCOPIC (IRRIGATION / IRRIGATOR) ×3 IMPLANT
SHEARS HARMONIC ACE PLUS 45CM (MISCELLANEOUS) ×3 IMPLANT
SLEEVE ADV FIXATION 5X100MM (TROCAR) ×6 IMPLANT
SLEEVE GASTRECTOMY 40FR VISIGI (MISCELLANEOUS) ×3 IMPLANT
SLEEVE XCEL OPT CAN 5 100 (ENDOMECHANICALS) IMPLANT
SOLUTION ANTI FOG 6CC (MISCELLANEOUS) ×3 IMPLANT
SPONGE LAP 18X18 X RAY DECT (DISPOSABLE) ×3 IMPLANT
STAPLER ECHELON BIOABSB 60 FLE (MISCELLANEOUS) ×21 IMPLANT
STAPLER ECHELON LONG 60 440 (INSTRUMENTS) ×3 IMPLANT
STAPLER RELOAD 60MM BLK (STAPLE) ×3
STAPLER RELOAD BLUE 60MM (STAPLE) ×9
STAPLER RELOAD GOLD 60MM (STAPLE) ×3
STAPLER RELOAD GREEN 60MM (STAPLE) ×3
SUT MNCRL AB 4-0 PS2 18 (SUTURE) ×3 IMPLANT
SUT SURGIDAC NAB ES-9 0 48 120 (SUTURE) IMPLANT
SUT VICRYL 0 TIES 12 18 (SUTURE) ×3 IMPLANT
SYR 10ML ECCENTRIC (SYRINGE) ×3 IMPLANT
SYR 20CC LL (SYRINGE) ×3 IMPLANT
SYR 50ML LL SCALE MARK (SYRINGE) ×3 IMPLANT
TOWEL OR 17X26 10 PK STRL BLUE (TOWEL DISPOSABLE) ×3 IMPLANT
TOWEL OR NON WOVEN STRL DISP B (DISPOSABLE) ×3 IMPLANT
TRAY FOLEY W/METER SILVER 14FR (SET/KITS/TRAYS/PACK) IMPLANT
TRAY FOLEY W/METER SILVER 16FR (SET/KITS/TRAYS/PACK) IMPLANT
TROCAR ADV FIXATION 5X100MM (TROCAR) ×3 IMPLANT
TROCAR BLADELESS 15MM (ENDOMECHANICALS) ×6 IMPLANT
TROCAR BLADELESS OPT 5 100 (ENDOMECHANICALS) ×3 IMPLANT
TUBING CONNECTING 10 (TUBING) ×3 IMPLANT
TUBING ENDO SMARTCAP (MISCELLANEOUS) ×3 IMPLANT
TUBING INSUF HEATED (TUBING) ×3 IMPLANT

## 2015-12-23 NOTE — Anesthesia Postprocedure Evaluation (Signed)
Anesthesia Post Note  Patient: Edwin Cox  Procedure(s) Performed: Procedure(s) (LRB): LAPAROSCOPIC GASTRIC SLEEVE RESECTION, UPPER ENDO (N/A) UPPER GI ENDOSCOPY  Patient location during evaluation: PACU Anesthesia Type: General Level of consciousness: awake and alert Pain management: pain level controlled Vital Signs Assessment: post-procedure vital signs reviewed and stable Respiratory status: spontaneous breathing, nonlabored ventilation, respiratory function stable and patient connected to nasal cannula oxygen Cardiovascular status: blood pressure returned to baseline and stable Postop Assessment: no signs of nausea or vomiting Anesthetic complications: no    Last Vitals:  Filed Vitals:   12/23/15 1323 12/23/15 1335  BP: 165/92 167/97  Pulse: 78 83  Temp: 36.7 C 36.8 C  Resp: 20 18    Last Pain:  Filed Vitals:   12/23/15 1335  PainSc: 5                  Phillips Groutarignan, Quadarius Henton

## 2015-12-23 NOTE — Transfer of Care (Signed)
Immediate Anesthesia Transfer of Care Note  Patient: Edwin Cox  Procedure(s) Performed: Procedure(s): LAPAROSCOPIC GASTRIC SLEEVE RESECTION, UPPER ENDO (N/A) UPPER GI ENDOSCOPY  Patient Location: PACU  Anesthesia Type:General  Level of Consciousness:  sedated, patient cooperative and responds to stimulation  Airway & Oxygen Therapy:Patient Spontanous Breathing and Patient connected to face mask oxgen  Post-op Assessment:  Report given to PACU RN and Post -op Vital signs reviewed and stable  Post vital signs:  Reviewed and stable  Last Vitals:  Filed Vitals:   12/23/15 0520 12/23/15 0945  BP: 160/97 173/104  Pulse: 87 80  Temp: 37.3 C   Resp: 18 10    Complications: No apparent anesthesia complications

## 2015-12-23 NOTE — Op Note (Signed)
12/23/2015 Bess Kindsaniel Taketa 03/16/1971 [id number]   PRE-OPERATIVE DIAGNOSIS:     Morbid obesity with BMI of 53   Essential hypertension   Prediabetes   Hypertriglyceridemia   Lumbosacral pain  POST-OPERATIVE DIAGNOSIS:  same  PROCEDURE:  Procedure(s): LAPAROSCOPIC SLEEVE GASTRECTOMY UPPER GI ENDOSCOPY  SURGEON:  Surgeon(s): Atilano InaEric M Walterine Amodei, MD FACS FASMBS  ASSISTANTS: Luretha MurphyMatthew Martin MD FACS  ANESTHESIA:   general  DRAINS: none   BOUGIE: 40 fr ViSiGi  LOCAL MEDICATIONS USED:  MARCAINE + Exparel  SPECIMEN:  Source of Specimen:  Greater curvature of stomach  DISPOSITION OF SPECIMEN:  PATHOLOGY  COUNTS:  YES  INDICATION FOR PROCEDURE: This is a very pleasant 45 year old morbidly obese male who has had unsuccessful attempts for sustained weight loss. He presents today for a planned laparoscopic sleeve gastrectomy with upper endoscopy. We have discussed the risk and benefits of the procedure extensively preoperatively. Please see my separate notes.  PROCEDURE: After obtaining informed consent and receiving 5000 units of subcutaneous heparin, the patient was [facility] and placed supine on the operating room table. General endotracheal anesthesia was established. Sequential compression devices were placed. A orogastric tube was placed. The patient's abdomen was prepped and draped in the usual standard surgical fashion. He received preoperative IV antibiotics. A surgical timeout was performed.  Access to the abdomen was achieved using a 5 mm 0 laparoscope thru a 5 mm trocar In the left upper Quadrant 2 fingerbreadths below the left subcostal margin using the Optiview technique. Pneumoperitoneum was smoothly established up to 15 mm of mercury. The laparoscope was advanced and the abdominal cavity was surveilled. The patient was then placed in reverse Trendelenburg. There was no evidence of a hiatal hernia on laparoscopy - gap in the left and right  crus anteriorly.  A 5 mm trocar was placed slightly above and to the left of the umbilicus under direct visualization.  The [facility] liver retractor was placed under the left lobe of the liver through a 5 mm trocar incision site in the subxiphoid position. A 5 mm trocar was placed in the lateral right upper quadrant along with a 15 mm trocar in the mid right abdomen. A final 5 mm trocar was placed in the lateral LUQ.  All under direct visualization after local had been infiltrated.  The stomach was inspected. It was completely decompressed and the orogastric tube was removed. The patient's preop UGI showed no hiatal hernia.   We identified the pylorus and measured 6 cm proximal to the pylorus and identified an area of where we would start taking down the short gastric vessels. Harmonic scalpel was used to take down the short gastric vessels along the greater curvature of the stomach. We were able to enter the lesser sac. We continued to march along the greater curvature of the stomach taking down the short gastrics. As we approached the gastrosplenic ligament we took care in this area not to injure the spleen. We were able to take down the entire gastrosplenic ligament. We then mobilized the fundus away from the left crus of diaphragm. There were a few posterior gastric avascular attachments which were taken down. This left the stomach completely mobilized. No vessels had been taken down along the lesser curvature of the stomach.  We then reidentified the pylorus. A 40Fr ViSiGi was then placed in the oropharynx and advanced down into the stomach and placed in the distal antrum and positioned along the lesser curvature. It was placed under  suction which secured the 40Fr ViSiGi in place along the lesser curve. Then using the Ethicon echelon 60 mm stapler with a black load with Seamguard, I placed a stapler along the antrum approximately 5 cm from the pylorus. The stapler was angled so that there is ample room  at the angularis incisura. I then fired the first staple load after inspecting it posteriorly to ensure adequate space both anteriorly and posteriorly. At this point I still was not completely past the angularis so with a green load with Seamguard, I placed the stapler in position just inside the prior stapleline. We then rotated the stomach to insure that there was adequate anteriorly as well as posteriorly. The stapler was then fired. At this point I started using 60 mm gold load staple cartridges with Seamguard. The echelon stapler was then repositioned with a 60 mm gold load with Seamguard and we continued to march up along the ViSiGi. My assistant was holding traction along the greater curvature stomach along the cauterized short gastric vessels ensuring that the stomach was symmetrically retracted. Prior to each firing of the staple, we rotated the stomach to ensure that there is adequate stomach left.  As we approached the fundus, I used 60 mm blue cartridge with Seamguard aiming slightly lateral to the esophageal fat pad. Although the staples on this fire had completely gone thru the last part of the stomach it had not completely cut it. Therefore 1 additional 60 blue load was used to free the remaining stomach. The sleeve was inspected. There is no evidence of cork screw. The staple line appeared hemostatic. The CRNA inflated the ViSiGi to the green zone and the upper abdomen was flooded with saline. There were no bubbles. The sleeve was decompressed and the ViSiGi removed. My assistant scrubbed out and performed an upper endoscopy. The sleeve easily distended with air and the scope was easily advanced to the pylorus. There is no evidence of internal bleeding or cork screwing. There was no narrowing at the angularis. There is no evidence of bubbles. Please see his operative note for further details. The gastric sleeve was decompressed and the endoscope was removed.  The greater curvature the stomach was  grasped with a laparoscopic grasper and removed from the 15 mm trocar site.  The liver retractor was removed. I then closed the 15 mm trocar site with 2 interrupted 0 Vicryl sutures through the fascia using the endoclose. The closure was viewed laparoscopically and it was airtight. 70 cc of Exparel was then infiltrated in the preperitoneal spaces around the trocar sites. Pneumoperitoneum was released. All trocar sites were closed with a 4-0 Monocryl in a subcuticular fashion followed by the application of Dermabond. The patient was extubated and taken to the recovery room in stable condition. All needle, instrument, and sponge counts were correct x2. There are no immediate complications  (1) 60 mm black with Seamguard (1) 60 mm green with Seamguard (1) 60 mm gold with seamguard (4) 60 mm blue with seamguard  PLAN OF CARE: Admit to inpatient   PATIENT DISPOSITION:  PACU - hemodynamically stable.   Delay start of Pharmacological VTE agent (>24hrs) due to surgical blood loss or risk of bleeding:  no  [name]. Andrey Campanile, MD, FACS FASMBS General, Bariatric, & Minimally Invasive Surgery [facility], [place]

## 2015-12-23 NOTE — Anesthesia Preprocedure Evaluation (Addendum)
Anesthesia Evaluation  Patient identified by MRN, date of birth, ID band Patient awake    Reviewed: Allergy & Precautions, NPO status , Patient's Chart, lab work & pertinent test results  Airway Mallampati: II  TM Distance: >3 FB Neck ROM: Full    Dental no notable dental hx.    Pulmonary former smoker,    Pulmonary exam normal breath sounds clear to auscultation       Cardiovascular hypertension, Pt. on medications Normal cardiovascular exam Rhythm:Regular Rate:Normal     Neuro/Psych negative neurological ROS  negative psych ROS   GI/Hepatic negative GI ROS, Neg liver ROS,   Endo/Other  diabetes, Type obesity  Renal/GU negative Renal ROS  negative genitourinary   Musculoskeletal negative musculoskeletal ROS (+)   Abdominal   Peds negative pediatric ROS (+)  Hematology negative hematology ROS (+)   Anesthesia Other Findings   Reproductive/Obstetrics negative OB ROS                           Anesthesia Physical Anesthesia Plan  ASA: III  Anesthesia Plan: General   Post-op Pain Management:    Induction: Intravenous  Airway Management Planned: Oral ETT  Additional Equipment:   Intra-op Plan:   Post-operative Plan: Extubation in OR  Informed Consent: I have reviewed the patients History and Physical, chart, labs and discussed the procedure including the risks, benefits and alternatives for the proposed anesthesia with the patient or authorized representative who has indicated his/her understanding and acceptance.   Dental advisory given  Plan Discussed with: CRNA  Anesthesia Plan Comments:         Anesthesia Quick Evaluation

## 2015-12-23 NOTE — Anesthesia Procedure Notes (Signed)
Procedure Name: Intubation Date/Time: 12/23/2015 7:22 AM Performed by: Epimenio SarinJARVELA, Patrich Heinze R Pre-anesthesia Checklist: Patient identified, Emergency Drugs available, Suction available, Patient being monitored and Timeout performed Patient Re-evaluated:Patient Re-evaluated prior to inductionOxygen Delivery Method: Circle system utilized Preoxygenation: Pre-oxygenation with 100% oxygen Intubation Type: IV induction Ventilation: Mask ventilation without difficulty Laryngoscope Size: Mac and 3 Grade View: Grade I Tube type: Oral Tube size: 7.5 mm Number of attempts: 1 Airway Equipment and Method: Stylet Placement Confirmation: ETT inserted through vocal cords under direct vision,  positive ETCO2 and breath sounds checked- equal and bilateral Secured at: 21 cm Tube secured with: Tape Dental Injury: Teeth and Oropharynx as per pre-operative assessment

## 2015-12-23 NOTE — Op Note (Signed)
Bess KindsDaniel Carmean [id number] 11/28/1970 12/23/2015  Preoperative diagnosis: sleeve gastrectomy in progress  Postoperative diagnosis: Same   Procedure: Upper endoscopy   Surgeon: Susy FrizzleMatt B. Daphine DeutscherMartin  M.D., FACS   Anesthesia: Gen.   Indications for procedure: This patient was undergoing a sleeve gastrectomy by Dr. Andrey CampanileWilson and endoscopy is performed to rule out bleeding or leaks.    Description of procedure: The endoscopy was placed in the mouth and into the oropharynx and under endoscopic vision it was advanced to the esophagogastric junction.  The stomach was insufflated and the sleeve examined and it was uniformly cylindrical down to the antrum.  Staple line not bleeding and no bubbles produced.   No bleeding or leaks were detected.  The scope was withdrawn without difficulty.     Matt B. Daphine DeutscherMartin, MD, FACS General, Bariatric, & Minimally Invasive Surgery [facility], [place]

## 2015-12-23 NOTE — Interval H&P Note (Signed)
History and Physical Interval Note:  12/23/2015 7:09 AM  Edwin Cox  has presented today for surgery, with the diagnosis of MORBID OBESITY  The various methods of treatment have been discussed with the patient and family. After consideration of risks, benefits and other options for treatment, the patient has consented to  Procedure(s): LAPAROSCOPIC GASTRIC SLEEVE RESECTION, UPPER ENDO (N/A) as a surgical intervention .  The patient's history has been reviewed, patient examined, no change in status, stable for surgery.  I have reviewed the patient's chart and labs.  Questions were answered to the patient's satisfaction.    Mary SellaEric M. Andrey CampanileWilson, MD, FACS General, Bariatric, & Minimally Invasive Surgery [facility], [place]   [facility] AndersonWILSON,[name] M

## 2015-12-23 NOTE — H&P (View-Only) (Signed)
Edwin Cox 12/12/2015 1:50 PM Location: [facility] Patient #: [id number] DOB: 1971-02-22 Divorced / Language: [place] / Race: White Male  History of Present Illness Edwin Cox M. Brittin Janik MD; 12/12/2015 2:08 PM) The patient is a 45 year old male who presents for a pre-op visit. He comes in today for his preoperative appointment he has been approved for laparoscopic sleeve gastrectomy for June 19. His initial visit was September 29. His weight at that time was 375 pounds. He denies any medical changes since he was initially seen. He did hurt his back but it has resolved. He denies any chest pain, chest pressure, shortness of breath, orthopnea, paroxysmal nocturnal dyspnea, TIAs or amaurosis fugax. He has occasional reflux and takes occasional over-the-counter reflux medication. His upper GI showed no hiatal hernia and no significant reflux. His EKG and chest x-ray were within normal limits. His initial bariatric evaluation labs in March 2017 were unremarkable except for hemoglobin A1c of 5.9, total cholesterol level of 199, triglyceride level of 202. He has started his preoperative diet   Problem List/Past Medical Atilano Ina, MD; 12/12/2015 2:10 PM) MORBID OBESITY WITH BMI OF 50.0-59.9, ADULT (E66.01) PREDIABETES (R73.03) HYPERTRIGLYCERIDEMIA (E78.1)  Other Problems Atilano Ina, MD; 12/12/2015 2:10 PM) BACK PAIN, LUMBOSACRAL (M54.5) ESSENTIAL HYPERTENSION (I10) GASTROESOPHAGEAL REFLUX DISEASE, ESOPHAGITIS PRESENCE NOT SPECIFIED (K21.9)  Past Surgical History Atilano Ina, MD; 12/12/2015 2:10 PM) No pertinent past surgical history  Diagnostic Studies History Atilano Ina, MD; 12/12/2015 2:10 PM) Colonoscopy never  Allergies Edwin Cox, CMA; 12/12/2015 1:52 PM) No Known Drug Allergies 04/04/2015  Medication History Atilano Ina, MD; 12/12/2015 2:10 PM) Lisinopril-Hydrochlorothiazide (20-12.5MG  Tablet, Oral) Active. Medications Reconciled OxyCODONE HCl  ( /5ML Solution, 5-10 Milliliter Oral every four hours, as needed, Taken starting 12/12/2015) Active. Protonix (  Tablet DR, 1 (one) Tablet Oral daily, Taken starting 12/12/2015) Active. Zofran ODT (  Tablet Disperse, 1 (one) Tablet Disperse Oral every six hours, as needed, Taken starting 12/12/2015) Active.  Social History Atilano Ina, MD; 12/12/2015 2:10 PM) Tobacco use Former smoker. No drug use Caffeine use Tea. Alcohol use Occasional alcohol use.  Family History Atilano Ina, MD; 12/12/2015 2:10 PM) Hypertension Father, Mother. Prostate Cancer Father.     Review of Systems Edwin Cox M. Aking Klabunde MD; 12/12/2015 2:07 PM) General Present- Weight Gain. Not Present- Appetite Loss, Chills, Fatigue, Fever, Night Sweats and Weight Loss. Skin Not Present- Change in Wart/Mole, Dryness, Hives, Jaundice, New Lesions, Non-Healing Wounds, Rash and Ulcer. HEENT Present- Wears glasses/contact lenses. Not Present- Earache, Hearing Loss, Hoarseness, Nose Bleed, Oral Ulcers, Ringing in the Ears, Seasonal Allergies, Sinus Pain, Sore Throat, Visual Disturbances and Yellow Eyes. Respiratory Present- Snoring. Not Present- Bloody sputum, Chronic Cough, Difficulty Breathing and Wheezing. Cardiovascular Not Present- Chest Pain, Difficulty Breathing Lying Down, Leg Cramps, Palpitations, Rapid Heart Rate, Shortness of Breath and Swelling of Extremities. Gastrointestinal Not Present- Abdominal Pain, Bloating, Bloody Stool, Change in Bowel Habits, Chronic diarrhea, Constipation, Difficulty Swallowing, Excessive gas, Gets full quickly at meals, Hemorrhoids, Indigestion, Nausea, Rectal Pain and Vomiting. Male Genitourinary Not Present- Blood in Urine, Change in Urinary Stream, Frequency, Impotence, Nocturia, Painful Urination, Urgency and Urine Leakage. Musculoskeletal Present- Back Pain. Not Present- Joint Pain, Joint Stiffness, Muscle Pain, Muscle Weakness and Swelling of Extremities. Neurological Present-  Headaches. Not Present- Decreased Memory, Fainting, Numbness, Seizures, Tingling, Tremor, Trouble walking and Weakness. Psychiatric Not Present- Anxiety, Bipolar, Change in Sleep Pattern, Depression, Fearful and Frequent crying. Endocrine Not Present- Cold Intolerance, Excessive Hunger, Hair Changes, Heat Intolerance  and New Diabetes. Hematology Not Present- Easy Bruising, Excessive bleeding, Gland problems, HIV and Persistent Infections.  Vitals Edwin Cox(Edwin Cox CMA; 12/12/2015 1:52 PM) 12/12/2015 1:52 PM Weight: 398 lb Height: 70in Body Surface Area: 2.8 m Body Mass Index: 57.11 kg/m  Temp.: 35F  Pulse: 100 (Regular)  BP: 140/82 (Sitting, Left Arm, Standard)      Physical Exam Edwin Cox(Kerrington Greenhalgh M. Tracy Kinner MD; 12/12/2015 2:06 PM)  General Mental Status-Alert. General Appearance-Consistent with stated age. Hydration-Well hydrated. Voice-Normal. Note: morbidly obese, central truncal  Head and Neck Head-normocephalic, atraumatic with no lesions or palpable masses. Trachea-midline. Thyroid Gland Characteristics - normal size and consistency.  Eye Eyeball - Bilateral-Extraocular movements intact. Sclera/Conjunctiva - Bilateral-No scleral icterus.  Chest and Lung Exam Chest and lung exam reveals -quiet, even and easy respiratory effort with no use of accessory muscles and on auscultation, normal breath sounds, no adventitious sounds and normal vocal resonance. Inspection Chest Wall - Normal. Back - normal.  Breast - Did not examine.  Cardiovascular Cardiovascular examination reveals -normal heart sounds, regular rate and rhythm with no murmurs and normal pedal pulses bilaterally.  Abdomen Inspection Inspection of the abdomen reveals - No Hernias. Skin - Scar - no surgical scars. Palpation/Percussion Palpation and Percussion of the abdomen reveal - Soft, Non Tender, No Rebound tenderness, No Rigidity (guarding) and No  hepatosplenomegaly. Auscultation Auscultation of the abdomen reveals - Bowel sounds normal.  Peripheral Vascular Upper Extremity Palpation - Pulses bilaterally normal.  Neurologic Neurologic evaluation reveals -alert and oriented x 3 with no impairment of recent or remote memory. Mental Status-Normal.  Neuropsychiatric The patient's mood and affect are described as -normal. Judgment and Insight-insight is appropriate concerning matters relevant to self.  Musculoskeletal Normal Exam - Left-Upper Extremity Strength Normal and Lower Extremity Strength Normal. Normal Exam - Right-Upper Extremity Strength Normal and Lower Extremity Strength Normal.  Lymphatic Head & Neck  General Head & Neck Lymphatics: Bilateral - Description - Normal. Axillary - Did not examine. Femoral & Inguinal - Did not examine.    Assessment & Plan Edwin Cox(Tadan Shill M. Kaynen Minner MD; 12/12/2015 2:10 PM)  MORBID OBESITY WITH BMI OF 50.0-59.9, ADULT (E66.01) Impression: We reviewed his preoperative workup. We discussed the results of his upper GI, EKG and chest x-ray. We discussed the importance of the preoperative diet plan. We discussed how this is designed to help him lose weight to try to shrink his liver. We did discuss the fact that if his liver is too large that we may have to abort surgery therefore, I stressed the importance of strict adherence to the preoperative diet plan. He was given his prescription for postoperative pain, nausea, and heartburn medications. He was encouraged to call the office if he has any additional questions between now and surgery.  Current Plans Started OxyCODONE HCl 5MG /5ML, 5-10 Milliliter every four hours, as needed, 200 Milliliter, 12/12/2015, No Refill. Started Protonix 40MG , 1 (one) Tablet daily, #30, 30 days starting 12/12/2015, Ref. x3. Started Zofran ODT 4MG , 1 (one) Tablet Disperse every six hours, as needed, #20, 12/12/2015, No Refill. Pt Education -  EMW_preopbariatric BACK PAIN, LUMBOSACRAL (M54.5) Impression: per pcp  ESSENTIAL HYPERTENSION (I10) Impression: per pcp  GASTROESOPHAGEAL REFLUX DISEASE, ESOPHAGITIS PRESENCE NOT SPECIFIED (K21.9) Impression: per pcp  PREDIABETES (R73.03) Impression: We discussed his findings of a hemoglobin A1c of 5.9. We discussed this makes him prediabetic. We discussed the importance of physical activity and good food choices.  HYPERTRIGLYCERIDEMIA (E78.1)  Mary SellaEric M. Andrey CampanileWilson, MD, FACS General, Bariatric, & Minimally Invasive Surgery [facility] [place]  

## 2015-12-24 LAB — CBC WITH DIFFERENTIAL/PLATELET
BASOS ABS: 0 10*3/uL (ref 0.0–0.1)
Basophils Relative: 0 %
Eosinophils Absolute: 0 10*3/uL (ref 0.0–0.7)
Eosinophils Relative: 0 %
HCT: 39.8 % (ref 39.0–52.0)
Hemoglobin: 13 g/dL (ref 13.0–17.0)
LYMPHS PCT: 22 %
Lymphs Abs: 2.2 10*3/uL (ref 0.7–4.0)
MCH: 28.3 pg (ref 26.0–34.0)
MCHC: 32.7 g/dL (ref 30.0–36.0)
MCV: 86.7 fL (ref 78.0–100.0)
MONO ABS: 0.7 10*3/uL (ref 0.1–1.0)
Monocytes Relative: 7 %
NEUTROS ABS: 6.9 10*3/uL (ref 1.7–7.7)
Neutrophils Relative %: 71 %
Platelets: 295 10*3/uL (ref 150–400)
RBC: 4.59 MIL/uL (ref 4.22–5.81)
RDW: 13.6 % (ref 11.5–15.5)
WBC: 9.8 10*3/uL (ref 4.0–10.5)

## 2015-12-24 LAB — COMPREHENSIVE METABOLIC PANEL
ALBUMIN: 4 g/dL (ref 3.5–5.0)
ALK PHOS: 45 U/L (ref 38–126)
ALT: 52 U/L (ref 17–63)
AST: 33 U/L (ref 15–41)
Anion gap: 6 (ref 5–15)
BUN: 9 mg/dL (ref 6–20)
CO2: 28 mmol/L (ref 22–32)
CREATININE: 0.68 mg/dL (ref 0.61–1.24)
Calcium: 8.9 mg/dL (ref 8.9–10.3)
Chloride: 104 mmol/L (ref 101–111)
GFR calc Af Amer: >60 mL/min (ref >60)
GLUCOSE: 133 mg/dL — AB (ref 65–99)
Potassium: 4 mmol/L (ref 3.5–5.1)
Sodium: 138 mmol/L (ref 135–145)
TOTAL PROTEIN: 7 g/dL (ref 6.5–8.1)
Total Bilirubin: 0.7 mg/dL (ref 0.3–1.2)

## 2015-12-24 LAB — HEMOGLOBIN AND HEMATOCRIT, BLOOD
HCT: 42.2 % (ref 39.0–52.0)
HEMOGLOBIN: 13.4 g/dL (ref 13.0–17.0)

## 2015-12-24 MED ORDER — PREMIER PROTEIN SHAKE
2.0000 [oz_av] | ORAL | Status: DC
  Administered 2015-12-24 – 2015-12-25 (×7): 2 [oz_av] via ORAL
  Filled 2015-12-24: qty 325.31

## 2015-12-24 NOTE — Discharge Instructions (Signed)

## 2015-12-24 NOTE — Progress Notes (Addendum)
Skip EstimableLaurie Deaton, RN-Bariatric Coordinator recently in to check on pt. Dr. Andrey CampanileWilson updated regarding pt progression-see Laurie's note. Plans to dc tomorrow. [place] [facility] [place].

## 2015-12-24 NOTE — Progress Notes (Signed)
Pt transferred to [facility]. Pt ambulated to room 1533. Tolerated well. Report given to Vonzell Schlatterawn James, RN.

## 2015-12-24 NOTE — Progress Notes (Deleted)
Patient alert and oriented, Post op day 1.  Provided support and encouragement.  Encouraged pulmonary toilet, ambulation and small sips of liquids.  All questions answered.  Will continue to monitor. 

## 2015-12-24 NOTE — Progress Notes (Signed)
Patient alert and oriented, Post op day 1.  Provided support and encouragement.  Encouraged pulmonary toilet, ambulation and small sips of liquids.  All questions answered.  Will continue to monitor. 

## 2015-12-24 NOTE — Progress Notes (Signed)
Advanced patient to protein shake around 1pm, has tolerated approximately 4 oz, but very little water today.  Patient complains of gas pain, fullness and inability to drink more.  Patient needs to increase volume prior to discharge to adequately hydrate at home.

## 2015-12-24 NOTE — Progress Notes (Addendum)
1 Day Post-Op  Subjective: Doing well. Some abdominal gas discomfort. Took some water in last night but not much. Min nausea. Ambulated. Pain ok with pain meds  Objective: Vital signs in last 24 hours: Temp:  [97.8 F (36.6 C)-98.5 F (36.9 C)] 97.8 F (36.6 C) (06/20 0554) Pulse Rate:  [74-90] 87 (06/20 0554) Resp:  [7-20] 17 (06/20 0554) BP: (136-187)/(77-116) 136/77 mmHg (06/20 0554) SpO2:  [93 %-100 %] 95 % (06/20 0554) Weight:  [179.6 kg (395 lb 15.1 oz)] 179.6 kg (395 lb 15.1 oz) (06/20 0543) Last BM Date: 12/23/15  Intake/Output from previous day: 06/19 0701 - 06/20 0700 In: 4212.1 [P.O.:60; I.V.:4152.1] Out: 3210 [Urine:3200; Blood:10] Intake/Output this shift:    Asleep, easily awakens, nontoxic No labored breathing. cta Reg Obese, soft, incisions c/d/i, mild upper abd TTP +SCDs  Lab Results:   Recent Labs  12/23/15 0950 12/24/15 0546  WBC 9.5 9.8  HGB 13.2 13.0  HCT 39.8 39.8  PLT 270 295   BMET  Recent Labs  12/23/15 0950 12/24/15 0546  NA 136 138  K 4.0 4.0  CL 102 104  CO2 24 28  GLUCOSE 156* 133*  BUN 15 9  CREATININE 0.91 0.68  CALCIUM 8.5* 8.9   PT/INR No results for input(s): LABPROT, INR in the last 72 hours. ABG No results for input(s): PHART, HCO3 in the last 72 hours.  Invalid input(s): PCO2, PO2  Studies/Results: No results found.  Anti-infectives: Anti-infectives    Start     Dose/Rate Route Frequency Ordered Stop   12/23/15 0518  cefoTEtan in Dextrose 5% (CEFOTAN) IVPB 2 g     2 g Intravenous On call to O.R. 12/23/15 0518 12/23/15 0725      Assessment/Plan: s/p Procedure(s): LAPAROSCOPIC GASTRIC SLEEVE RESECTION, UPPER ENDO (N/A) UPPER GI ENDOSCOPY  Doing well Cont POD 1 diet - water ad lib, adv to POD 2 diet mid morning Po meds Ambulate, chemical vte prophylaxis If does well this am/early pm, will consider dc later today Start dc teaching  Late entry - The patient does not meet criteria for discharge  because they did not meet oral intake goals and are at high risk of dehydration.    Mary SellaEric M. Andrey CampanileWilson, MD, FACS General, Bariatric, & Minimally Invasive Surgery [facility], [place]   LOS: 1 day    Atilano InaWILSON,Allyanna Appleman M 12/24/2015

## 2015-12-24 NOTE — Plan of Care (Signed)
Problem: Food- and Nutrition-Related Knowledge Deficit (NB-1.1) Goal: Nutrition education Formal process to instruct or train a patient/client in a skill or to impart knowledge to help patients/clients voluntarily manage or modify food choices and eating behavior to maintain or improve health. Outcome: Completed/Met Date Met:  12/24/15 Nutrition Education Note  Received consult for diet education per DROP protocol.   Discussed 2 week post op diet with pt. Emphasized that liquids must be non carbonated, non caffeinated, and sugar free. Fluid goals discussed. Reviewed progression of diet to include soft proteins at 7-10 days post-op. Pt to follow up with outpatient bariatric RD for further diet progression after 2 weeks. Multivitamins and minerals also reviewed. Teach back method used, pt expressed understanding, expect good compliance.   Diet: First 2 Weeks  You will see the dietitian about two (2) weeks after your surgery. The dietitian will increase the types of foods you can eat if you are handling liquids well:  If you have severe vomiting or nausea and cannot handle clear liquids lasting longer than 1 day, call your surgeon  Protein Shake  Drink at least 2 ounces of shake 5-6 times per day  Each serving of protein shakes (usually 8 - 12 ounces) should have a minimum of:  15 grams of protein  And no more than 5 grams of carbohydrate  Goal for protein each day:  Men = 80 grams per day  Women = 60 grams per day  Protein powder may be added to fluids such as non-fat milk or Lactaid milk or Soy milk (limit to 35 grams added protein powder per serving)   Hydration  Slowly increase the amount of water and other clear liquids as tolerated (See Acceptable Fluids)  Slowly increase the amount of protein shake as tolerated  Sip fluids slowly and throughout the day  May use sugar substitutes in small amounts (no more than 6 - 8 packets per day; i.e. Splenda)   Fluid Goal  The first goal is to  drink at least 8 ounces of protein shake/drink per day (or as directed by the nutritionist); some examples of protein shakes are [employer], [employer], EAS Edge HP, and Unjury. See handout from pre-op Bariatric Education Class:  Slowly increase the amount of protein shake you drink as tolerated  You may find it easier to slowly sip shakes throughout the day  It is important to get your proteins in first  Your fluid goal is to drink 64 - 100 ounces of fluid daily  It may take a few weeks to build up to this  32 oz (or more) should be clear liquids  And  32 oz (or more) should be full liquids (see below for examples)  Liquids should not contain sugar, caffeine, or carbonation   Clear Liquids:  Water or Sugar-free flavored water (i.e. Fruit H2O, Propel)  Decaffeinated coffee or tea (sugar-free)  Crystal Lite, Wyler's Lite, Minute Maid Lite  Sugar-free Jell-O  Bouillon or broth  Sugar-free Popsicle: *Less than 20 calories each; Limit 1 per day   Full Liquids:  Protein Shakes/Drinks + 2 choices per day of other full liquids  Full liquids must be:  No More Than 12 grams of Carbs per serving  No More Than 3 grams of Fat per serving  Strained low-fat cream soup  Non-Fat milk  Fat-free Lactaid Milk  Sugar-free yogurt (Dannon Lite & Fit, Greek yogurt)     Clayton Bibles, MS, RD, LDN Pager: [phone] After Hours Pager: [phone]

## 2015-12-25 LAB — CBC WITH DIFFERENTIAL/PLATELET
BASOS PCT: 0 %
Basophils Absolute: 0 10*3/uL (ref 0.0–0.1)
Eosinophils Absolute: 0.1 10*3/uL (ref 0.0–0.7)
Eosinophils Relative: 2 %
HEMATOCRIT: 42.4 % (ref 39.0–52.0)
HEMOGLOBIN: 13.5 g/dL (ref 13.0–17.0)
LYMPHS ABS: 2.8 10*3/uL (ref 0.7–4.0)
LYMPHS PCT: 29 %
MCH: 28.6 pg (ref 26.0–34.0)
MCHC: 31.8 g/dL (ref 30.0–36.0)
MCV: 89.8 fL (ref 78.0–100.0)
MONOS PCT: 6 %
Monocytes Absolute: 0.5 10*3/uL (ref 0.1–1.0)
NEUTROS ABS: 6 10*3/uL (ref 1.7–7.7)
NEUTROS PCT: 63 %
Platelets: 301 10*3/uL (ref 150–400)
RBC: 4.72 MIL/uL (ref 4.22–5.81)
RDW: 13.9 % (ref 11.5–15.5)
WBC: 9.4 10*3/uL (ref 4.0–10.5)

## 2015-12-25 MED ORDER — OXYCODONE HCL 5 MG/5ML PO SOLN: 5.0000 mg | ORAL | Status: AC | PRN

## 2015-12-25 NOTE — Progress Notes (Signed)
Patient is alert and oriented with pain controlled and vital signs stable. Patient given discharge instructions and all questions/concerns answered. Patient verbalized understanding of all discharge instructions.

## 2015-12-25 NOTE — Progress Notes (Signed)
Patient alert and oriented, pain is controlled. Patient is tolerating fluids,  advanced to protein shake today, patient tolerated well today.  We discussed the importance of fluid intake even without hunger, thirst or desire, encouraged patient to use smart phone to set alarms to remind him to drink.  Reviewed Gastric sleeve discharge instructions with patient and patient is able to articulate understanding.  Provided information on BELT program, Support Group and [facility] outpatient pharmacy. All questions answered, will continue to monitor.

## 2015-12-25 NOTE — Discharge Summary (Signed)
Physician Discharge Summary  Edwin Cox ZOX:[id number] DOB: June 22, 1971 DOA: 12/23/2015  PCP: Dorothey Baseman, MD  Admit date: 12/23/2015 Discharge date: 12/25/2015  Recommendations for Outpatient Follow-up:   Follow-up Information    Follow up with Atilano Ina, MD. Go on 01/17/2016.   Specialty:  General Surgery   Why:  For Post-Op Check at 2:00   Contact information:   [address] [place] [address] [phone]       Follow up with Atilano Ina, MD. Go on 02/05/2016.   Specialty:  General Surgery   Why:  For Post-Op Check at 9:30   Contact information:   [address] [place] [address] [phone]      Discharge Diagnoses:  Principal Problem:   Morbid obesity with BMI of 50.0-59.9, adult [facility]) Active Problems:   Essential hypertension   Prediabetes   Hypertriglyceridemia   Lumbosacral pain   S/P laparoscopic sleeve gastrectomy   Surgical Procedure: Laparoscopic Sleeve Gastrectomy, upper endoscopy  Discharge Condition: Good Disposition: Home  Diet recommendation: Postoperative sleeve gastrectomy diet (liquids only)  Filed Weights   12/23/15 0520 12/24/15 0543 12/25/15 0730  Weight: 177.13 kg (390 lb 8 oz) 179.6 kg (395 lb 15.1 oz) 175.27 kg (386 lb 6.4 oz)     Hospital Course:  The patient was admitted for a planned laparoscopic sleeve gastrectomy. Please see operative note. Preoperatively the patient was given 5000 units of subcutaneous heparin for DVT prophylaxis. Postoperative prophylactic Lovenox dosing was started on the morning of postoperative day 1. He was started on ice chips late on POD 0. On POD 1 the patient was advanced to water which they tolerated but volume was not adequate. He was also given shakes on POD 1 but again his volume intake was not sufficient/safe for discharge. On postoperative day 2 he was doing ok. He still has some mild epigastric pain that would last a few seconds. His HTN was being managed but  otherwise his vital signs were stable. The patient was ambulating without difficulty. Their vital signs are stable without fever or tachycardia. Their hemoglobin had remained stable.  The patient had received discharge instructions and counseling. They were deemed stable for discharge.  BP 154/81 mmHg  Pulse 89  Temp(Src) 97.5 F (36.4 C) (Oral)  Resp 18  Ht 6' (1.829 m)  Wt 175.27 kg (386 lb 6.4 oz)  BMI 52.39 kg/m2  SpO2 96%  Gen: alert, NAD, non-toxic appearing, up brushing teeth Pupils: equal, no scleral icterus Pulm: Lungs clear to auscultation, symmetric chest rise CV: regular rate and rhythm Abd: soft, min tender, nondistended. Some bruising at extraction site. No cellulitis. No incisional hernia Ext: no edema, no calf tenderness Skin: no rash, no jaundice  Discharge Instructions  Discharge Instructions    Ambulate hourly while awake    Complete by:  As directed      Call MD for:  difficulty breathing, headache or visual disturbances    Complete by:  As directed      Call MD for:  persistant dizziness or light-headedness    Complete by:  As directed      Call MD for:  persistant nausea and vomiting    Complete by:  As directed      Call MD for:  redness, tenderness, or signs of infection (pain, swelling, redness, odor or green/yellow discharge around incision site)    Complete by:  As directed      Call MD for:  severe uncontrolled pain  Complete by:  As directed      Call MD for:  temperature >101 F    Complete by:  As directed      Diet bariatric full liquid    Complete by:  As directed      Discharge instructions    Complete by:  As directed   See bariatric discharge instructions     Incentive spirometry    Complete by:  As directed   Perform hourly while awake            Medication List    TAKE these medications        lisinopril-hydrochlorothiazide 20-12.5 MG tablet  Commonly known as:  PRINZIDE,ZESTORETIC  Take 2 tablets by mouth daily.  Notes to  Patient:  Monitor Blood Pressure Daily and keep a log for primary care physician.  Monitor for symptoms of dehydration.  You may need to make changes to your medications with rapid weight loss.        oxyCODONE 5 MG/5ML solution  Commonly known as:  ROXICODONE  Take 5-10 mLs (5-10 mg total) by mouth every 4 (four) hours as needed for moderate pain or severe pain.           Follow-up Information    Follow up with Atilano InaWILSON,Ray Glacken M, MD. Go on 01/17/2016.   Specialty:  General Surgery   Why:  For Post-Op Check at 2:00   Contact information:   [address] [place] [address] [phone]       Follow up with Atilano InaWILSON,Arelene Moroni M, MD. Go on 02/05/2016.   Specialty:  General Surgery   Why:  For Post-Op Check at 9:30   Contact information:   [address] [place] [address] [phone]        The results of significant diagnostics from this hospitalization (including imaging, microbiology, ancillary and laboratory) are listed below for reference.    Significant Diagnostic Studies: No results found.  Labs: Basic Metabolic Panel:  Recent Labs Lab 12/23/15 0950 12/24/15 0546  NA 136 138  K 4.0 4.0  CL 102 104  CO2 24 28  GLUCOSE 156* 133*  BUN 15 9  CREATININE 0.91 0.68  CALCIUM 8.5* 8.9   Liver Function Tests:  Recent Labs Lab 12/23/15 0950 12/24/15 0546  AST 38 33  ALT 45 52  ALKPHOS 49 45  BILITOT 0.6 0.7  PROT 6.3* 7.0  ALBUMIN 3.8 4.0    CBC:  Recent Labs Lab 12/23/15 0950 12/24/15 0546 12/24/15 1548 12/25/15 0454  WBC 9.5 9.8  --  9.4  NEUTROABS 6.5 6.9  --  6.0  HGB 13.2 13.0 13.4 13.5  HCT 39.8 39.8 42.2 42.4  MCV 86.0 86.7  --  89.8  PLT 270 295  --  301    CBG:  Recent Labs Lab 12/23/15 0517 12/23/15 0952  GLUCAP 99 160*    Principal Problem:   Morbid obesity with BMI of 50.0-59.9, adult [facility](HCC) Active Problems:   Essential hypertension   Prediabetes   Hypertriglyceridemia   Lumbosacral pain   S/P  laparoscopic sleeve gastrectomy   Time coordinating discharge: 20 min  Signed:  Atilano InaEric M Kashmir Lysaght, MD [facility], [place] [phone] 12/25/2015, 8:00 AM

## 2016-01-06 ENCOUNTER — Encounter: Payer: Managed Care, Other (non HMO) | Attending: General Surgery

## 2016-01-06 NOTE — Progress Notes (Signed)
Bariatric Class:  Appt start time: 1530 end time:  1630.  2 Week Post-Operative Nutrition Class  Patient was seen on 01/06/2016 for Post-Operative Nutrition education at [facility].   Surgery date: 12/23/2015 Surgery type: Sleeve gastrectomy Start weight at [facility]: 384.5 lbs on 05/29/2015 Weight today: 368 lbs Weight change: 31 lbs  TANITA  BODY COMP RESULTS  11/25/15 01/06/16   BMI (kg/m^2) 57.2 N/A   Fat Mass (lbs) 266.6    Fat Free Mass (lbs) 132.4    Total Body Water (lbs) N/A     The following the learning objectives were met by the patient during this course:  Identifies Phase 3A (Soft, High Proteins) Dietary Goals and will begin from 2 weeks post-operatively to 2 months post-operatively  Identifies appropriate sources of fluids and proteins   States protein recommendations and appropriate sources post-operatively  Identifies the need for appropriate texture modifications, mastication, and bite sizes when consuming solids  Identifies appropriate multivitamin and calcium sources post-operatively  Describes the need for physical activity post-operatively and will follow MD recommendations  States when to call healthcare provider regarding medication questions or post-operative complications  Handouts given during class include:  Phase 3A: Soft, High Protein Diet Handout  Follow-Up Plan: Patient will follow-up at [facility] in 4 weeks for 6 week post-op nutrition visit for diet advancement per MD.

## 2016-01-14 ENCOUNTER — Ambulatory Visit: Payer: Self-pay

## 2016-02-17 ENCOUNTER — Encounter: Payer: Managed Care, Other (non HMO) | Attending: General Surgery | Admitting: Dietician

## 2016-02-17 ENCOUNTER — Encounter: Payer: Self-pay | Admitting: Dietician

## 2016-02-17 DIAGNOSIS — Z6841 Body Mass Index (BMI) 40.0 and over, adult: Secondary | ICD-10-CM

## 2016-02-17 NOTE — Patient Instructions (Addendum)
Goals:  Follow Phase 3B: High Protein + Non-Starchy Vegetables  Eat 3-6 small meals/snacks, every 3-5 hrs  Increase lean protein foods to meet 80g goal  Increase fluid intake to 64oz +  Avoid drinking 15 minutes before, during and 30 minutes after eating  Aim for >30 min of physical activity daily  Find a soup that is higher in protein!  Drink the other half of your protein shake until you are able to tolerate 2 oz of meat  Surgery date: 12/23/2015 Surgery type: Sleeve gastrectomy [facility]: 384.5 lbs on 05/29/2015 (highest weight 407 lbs per patient) Weight today: 332.4 lbs Weight change: 36 lbs Total weight lost: 74.6 lbs

## 2016-02-17 NOTE — Progress Notes (Signed)
  Follow-up visit:  8 Weeks Post-Operative sleeve gastrectomy Surgery  Medical Nutrition Therapy:  Appt start time: 0910 end time:  0930.  Primary concerns today: Post-operative Bariatric Surgery Nutrition Management. Edwin Cox returns having lost another 36 pounds. He is eating small, high-protein meals every 2 hours. Can tolerate about 1 oz meat at a time. Reports that he is getting 700-800 calories per day. Has tried some veggies (mushrooms and asparagus) and tolerates.   Surgery date: 12/23/2015 Surgery type: Sleeve gastrectomy [facility]: 384.5 lbs on 05/29/2015 (highest weight 407 lbs per patient) Weight today: 332.4 lbs Weight change: 36 lbs Total weight lost: 74.6 lbs   Preferred Learning Style:   No preference indicated   Learning Readiness:   Ready  24-hr recall: B (6:45-7AM): 2-3 strips bacon (5g) Sleeps from 8am-1pm Snk (2PM): 1/2 Premier shake (15g)  L (4PM): tuna foil pack or 1 oz pork chop (17g) Snk (6:30PM): P3 pack (14g)  D (8-8:30PM): Campbell's soup (3g) Snk (1-2AM): pork chops, asparagus, and small amount of mashed potatoes (7g)  Fluid intake: water (32 oz), Powerade Zero (40-60 oz) Estimated total protein intake: 60-70g  Medications: see list Supplementation: yes  Using straws: no Drinking while eating: yes, patient reports he does not think he can break this habit Hair loss: unknown  Carbonated beverages: none N/V/D/C: regurgitation occasionally, especially on the weekends Dumping syndrome: none  Recent physical activity:  Elliptical for 20 minutes + weights (almost an hour 3 days a week)  Progress Towards Goal(s):  In progress.  Handouts given during visit include:  Phase 3B lean protein + non starchy vegetables   Nutritional Diagnosis:  [place]-3.3 Overweight/obesity related to past poor dietary habits and physical inactivity as evidenced by patient w/ recent sleeve gastrectomy surgery following dietary guidelines for continued weight  loss.     Intervention:  Nutrition counseling provided.  Teaching Method Utilized:  Visual Auditory Hands on  Barriers to learning/adherence to lifestyle change: none  Demonstrated degree of understanding via:  Teach Back   Monitoring/Evaluation:  Dietary intake, exercise, and body weight. Follow up in 3 months for 5 month post-op visit.

## 2016-05-19 ENCOUNTER — Encounter: Payer: Self-pay | Admitting: Dietician

## 2016-05-19 ENCOUNTER — Encounter: Payer: Managed Care, Other (non HMO) | Attending: General Surgery | Admitting: Dietician

## 2016-05-19 DIAGNOSIS — Z6841 Body Mass Index (BMI) 40.0 and over, adult: Secondary | ICD-10-CM

## 2016-05-19 NOTE — Progress Notes (Signed)
  Follow-up visit:  5 months Post-Operative sleeve gastrectomy Surgery  Medical Nutrition Therapy:  Appt start time: 1005 end time:  1030  Primary concerns today: Post-operative Bariatric Surgery Nutrition Management. Edwin Cox returns having lost another 44 pounds. Recently had shoulder surgery and just finished physical therapy. Excited about his weight loss. Getting 50-60 grams protein per day and knows this is not enough. Still struggles with vomiting if he eats too fast (especially on the weekends).    Surgery date: 12/23/2015 Surgery type: Sleeve gastrectomy [facility] 384.5 lbs on 05/29/2015 (highest weight 407 lbs per patient) Weight today: 288.3  Weight change: 44 lbs Total weight lost: 119 lbs (130 lbs per patient scale)  Goal: 220-250 lbs (270 lbs by the end of 2017)   Preferred Learning Style:   No preference indicated   Learning Readiness:   Ready  24-hr recall: B (6:45-7AM): 3 strips bacon (5g) Sleeps from 8am-1pm Snk (3 PM): tuna foil pack with olives (17g) L (6PM): tuna foil pack (17g) D (8-8:30PM): 1/2 can Progresso soup (7g) Snk (1-2AM): 1.5 hot dogs (10g)  Fluid intake: water (32 oz), Powerade Zero, diet green tea (40-60 oz) Estimated total protein intake: 50-60g  Medications: see list Supplementation: yes  Using straws: no Drinking while eating: yes, patient reports he does not think he can break this habit Hair loss: unknown  Carbonated beverages: none N/V/D/C: regurgitation occasionally, especially on the weekends Dumping syndrome: none  Recent physical activity:  Recently resumed the gym  Progress Towards Goal(s):  In progress.  Handouts given during visit include:  none   Nutritional Diagnosis:  [place]-3.3 Overweight/obesity related to past poor dietary habits and physical inactivity as evidenced by patient w/ recent sleeve gastrectomy surgery following dietary guidelines for continued weight loss.     Intervention:  Nutrition  counseling provided.  Teaching Method Utilized:  Visual Auditory Hands on  Barriers to learning/adherence to lifestyle change: none  Demonstrated degree of understanding via:  Teach Back   Monitoring/Evaluation:  Dietary intake, exercise, and body weight. Follow up prn.

## 2016-05-19 NOTE — Patient Instructions (Addendum)
Goals:  Follow Phase 3B: High Protein + Non-Starchy Vegetables  Eat 3-6 small meals/snacks, every 3-5 hrs  Increase lean protein foods to meet 80g goal  Increase fluid intake to 64oz +  Avoid drinking 15 minutes before, during and 30 minutes after eating  Aim for >30 min of physical activity daily  Start having at least 1 protein shake per day  Aim for 80-90 grams protein per day  Habits to keep! -Stopping when you're full -Staying active -Eating more mindfully and eating when you're hungry -Avoiding soft drinks, sweet tea, and any drink with sugar -Limiting sweets and starches -Balance!   Surgery date: 12/23/2015 Surgery type: Sleeve gastrectomy [facility] 384.5 lbs on 05/29/2015 (highest weight 407 lbs per patient) Weight today: 288.3  Weight change: 44 lbs Total weight lost: 119 lbs (130 lbs per patient scale)

## 2017-09-24 DIAGNOSIS — R300 Dysuria: Secondary | ICD-10-CM | POA: Diagnosis not present

## 2017-09-24 DIAGNOSIS — N410 Acute prostatitis: Secondary | ICD-10-CM | POA: Diagnosis not present

## 2017-12-27 DIAGNOSIS — M19012 Primary osteoarthritis, left shoulder: Secondary | ICD-10-CM | POA: Diagnosis not present

## 2017-12-27 DIAGNOSIS — M7542 Impingement syndrome of left shoulder: Secondary | ICD-10-CM | POA: Diagnosis not present

## 2017-12-27 DIAGNOSIS — M25512 Pain in left shoulder: Secondary | ICD-10-CM | POA: Diagnosis not present

## 2018-01-07 DIAGNOSIS — M19012 Primary osteoarthritis, left shoulder: Secondary | ICD-10-CM | POA: Diagnosis not present

## 2018-02-07 DIAGNOSIS — M7542 Impingement syndrome of left shoulder: Secondary | ICD-10-CM | POA: Diagnosis not present

## 2018-02-07 DIAGNOSIS — M19012 Primary osteoarthritis, left shoulder: Secondary | ICD-10-CM | POA: Diagnosis not present

## 2018-02-11 ENCOUNTER — Other Ambulatory Visit: Payer: Self-pay | Admitting: Specialist

## 2018-02-11 DIAGNOSIS — M19012 Primary osteoarthritis, left shoulder: Secondary | ICD-10-CM

## 2018-07-08 DIAGNOSIS — I10 Essential (primary) hypertension: Secondary | ICD-10-CM | POA: Diagnosis not present

## 2018-07-18 DIAGNOSIS — I10 Essential (primary) hypertension: Secondary | ICD-10-CM | POA: Diagnosis not present

## 2018-08-08 DIAGNOSIS — I10 Essential (primary) hypertension: Secondary | ICD-10-CM | POA: Diagnosis not present

## 2019-07-19 ENCOUNTER — Ambulatory Visit: Payer: Self-pay | Admitting: Urology

## 2019-08-11 ENCOUNTER — Other Ambulatory Visit: Payer: Self-pay

## 2019-08-11 ENCOUNTER — Encounter: Payer: Self-pay | Admitting: Urology

## 2019-08-11 ENCOUNTER — Ambulatory Visit: Payer: 59 | Admitting: Urology

## 2019-08-11 VITALS — BP 126/77 | HR 106 | Ht 72.0 in | Wt 207.0 lb

## 2019-08-11 DIAGNOSIS — Z8042 Family history of malignant neoplasm of prostate: Secondary | ICD-10-CM | POA: Diagnosis not present

## 2019-08-11 DIAGNOSIS — R972 Elevated prostate specific antigen [PSA]: Secondary | ICD-10-CM | POA: Diagnosis not present

## 2019-08-11 NOTE — Progress Notes (Signed)
08/11/2019 9:46 PM   Edwin Cox Nov 09, 1970 [id number]  Referring provider: Dorothey Baseman, MD [phone] [place],  [place] [address]  Chief Complaint  Patient presents with  . Elevated PSA    HPI: Edwin Cox is a 49 y.o. male seen in consultation at the request of Dr. Terance Hart for evaluation of an elevated PSA.  A PSA drawn on 05/16/2019 was 4.83. A prior PSA June 2015 was 0.59. Around the time of this blood draw he had a urinalysis which was normal and a negative urine culture.  He denies bothersome lower urinary tract symptoms. No dysuria, hematuria or UTI. Denies flank, abdominal or pelvic pain.  He does have a family history prostate cancer states his father was diagnosed with cancer in his late 22s.   PMH: Past Medical History:  Diagnosis Date  . Diabetes mellitus without complication (HCC)    borderline on no meds   . GERD (gastroesophageal reflux disease)   . Hypertension   . Obesity     Surgical History: Past Surgical History:  Procedure Laterality Date  . EYE SURGERY     corneal transplant in both eyes   . LAPAROSCOPIC GASTRIC SLEEVE RESECTION N/A 12/23/2015   Procedure: LAPAROSCOPIC GASTRIC SLEEVE RESECTION, UPPER ENDO;  Surgeon: Gaynelle Adu, MD;  Location: [facility];  Service: General;  Laterality: N/A;  . UPPER GI ENDOSCOPY  12/23/2015   Procedure: UPPER GI ENDOSCOPY;  Surgeon: Gaynelle Adu, MD;  Location: [facility];  Service: General;;    Home Medications:  Allergies as of 08/11/2019   No Known Allergies     Medication List       Accurate as of August 11, 2019 11:59 PM. If you have any questions, ask your nurse or doctor.        amLODipine 10 MG tablet Commonly known as: NORVASC Take 10 mg by mouth daily.   lisinopril-hydrochlorothiazide 20-12.5 MG tablet Commonly known as: ZESTORETIC Take 2 tablets by mouth daily.   Multi-Vitamin tablet Take by mouth.   oxyCODONE 5 MG/5ML solution Commonly known as: ROXICODONE Take 5-10 mLs (5-10 mg  total) by mouth every 4 (four) hours as needed for moderate pain or severe pain.   sildenafil 100 MG tablet Commonly known as: VIAGRA Take 100 mg by mouth daily as needed.       Allergies: No Known Allergies  Family History: Family History  Problem Relation Age of Onset  . Diabetes Mother   . Coronary artery disease Maternal Grandfather     Social History:  reports that he quit smoking about 8 years ago. He has quit using smokeless tobacco. He reports that he does not drink alcohol or use drugs.  ROS: UROLOGY Frequent Urination?: No Hard to postpone urination?: No Burning/pain with urination?: No Get up at night to urinate?: No Leakage of urine?: No Urine stream starts and stops?: No Trouble starting stream?: No Do you have to strain to urinate?: No Blood in urine?: No Urinary tract infection?: No Sexually transmitted disease?: No Injury to kidneys or bladder?: No Painful intercourse?: No Weak stream?: No Erection problems?: No Penile pain?: No  Gastrointestinal Nausea?: No Vomiting?: No Indigestion/heartburn?: No Diarrhea?: No Constipation?: No  Constitutional Fever: No Night sweats?: No Weight loss?: No Fatigue?: No  Skin Skin rash/lesions?: No Itching?: No  Eyes Blurred vision?: No Double vision?: No  Ears/Nose/Throat Sore throat?: No Sinus problems?: No  Hematologic/Lymphatic Swollen glands?: No Easy bruising?: No  Cardiovascular Leg swelling?: No Chest pain?: No  Respiratory Cough?:  No Shortness of breath?: No  Endocrine Excessive thirst?: No  Musculoskeletal Back pain?: No Joint pain?: No  Neurological Headaches?: No Dizziness?: No  Psychologic Depression?: No Anxiety?: No  Physical Exam: BP 126/77   Pulse (!) 106   Ht 6' (1.829 m)   Wt 207 lb (93.9 kg)   BMI 28.07 kg/m   Constitutional:  Alert and oriented, No acute distress. HEENT: [place] AT, moist mucus membranes.  Trachea midline, no masses. Cardiovascular: No  clubbing, cyanosis, or edema. Respiratory: Normal respiratory effort, no increased work of breathing. GU: Prostate 35 g, smooth without nodules Skin: No rashes, bruises or suspicious lesions. Neurologic: Grossly intact, no focal deficits, moving all 4 extremities. Psychiatric: Normal mood and affect.   Assessment & Plan:    - Elevated PSA 49 y.o. male with a family history prostate cancer and elevated PSA 4.83. This is significant elevated from a previous value of 0.59 in 2015.  Although PSA is a prostate cancer screening test he was informed that cancer is not the most common cause of an elevated PSA. Other potential causes including BPH and inflammation were discussed. He was informed that the only way to adequately diagnose prostate cancer would be a transrectal ultrasound and biopsy of the prostate. The procedure was discussed including potential risks of bleeding and infection/sepsis. He was also informed that a negative biopsy does not conclusively rule out the possibility that prostate cancer may be present and that continued monitoring is required. The use of multiparametric prostate MRI was also discussed.  I have initially recommended a repeat PSA today and if it remains elevated scheduling a prostate MRI.   Abbie Sons, [facility] [address] [place], [place] [address] [phone]

## 2019-08-12 LAB — PSA: Prostate Specific Ag, Serum: 1.1 ng/mL (ref 0.0–4.0)

## 2019-08-13 ENCOUNTER — Encounter: Payer: Self-pay | Admitting: Urology

## 2019-09-04 ENCOUNTER — Other Ambulatory Visit: Payer: Self-pay

## 2019-09-04 ENCOUNTER — Encounter: Payer: Self-pay | Admitting: Emergency Medicine

## 2019-09-04 ENCOUNTER — Emergency Department
Admission: EM | Admit: 2019-09-04 | Discharge: 2019-09-05 | Disposition: A | Discharge status: Other Acute Inpatient Hospital | Payer: 59 | Attending: Emergency Medicine | Admitting: Emergency Medicine

## 2019-09-04 ENCOUNTER — Emergency Department: Payer: 59

## 2019-09-04 DIAGNOSIS — Z9884 Bariatric surgery status: Secondary | ICD-10-CM | POA: Diagnosis not present

## 2019-09-04 DIAGNOSIS — Z87891 Personal history of nicotine dependence: Secondary | ICD-10-CM | POA: Diagnosis not present

## 2019-09-04 DIAGNOSIS — Y999 Unspecified external cause status: Secondary | ICD-10-CM | POA: Insufficient documentation

## 2019-09-04 DIAGNOSIS — Z20822 Contact with and (suspected) exposure to covid-19: Secondary | ICD-10-CM | POA: Insufficient documentation

## 2019-09-04 DIAGNOSIS — E119 Type 2 diabetes mellitus without complications: Secondary | ICD-10-CM | POA: Insufficient documentation

## 2019-09-04 DIAGNOSIS — I10 Essential (primary) hypertension: Secondary | ICD-10-CM | POA: Insufficient documentation

## 2019-09-04 DIAGNOSIS — S20219A Contusion of unspecified front wall of thorax, initial encounter: Secondary | ICD-10-CM | POA: Insufficient documentation

## 2019-09-04 DIAGNOSIS — X58XXXA Exposure to other specified factors, initial encounter: Secondary | ICD-10-CM | POA: Diagnosis not present

## 2019-09-04 DIAGNOSIS — Z79899 Other long term (current) drug therapy: Secondary | ICD-10-CM | POA: Insufficient documentation

## 2019-09-04 DIAGNOSIS — Y939 Activity, unspecified: Secondary | ICD-10-CM | POA: Insufficient documentation

## 2019-09-04 DIAGNOSIS — T148XXA Other injury of unspecified body region, initial encounter: Secondary | ICD-10-CM

## 2019-09-04 DIAGNOSIS — R079 Chest pain, unspecified: Secondary | ICD-10-CM | POA: Diagnosis present

## 2019-09-04 DIAGNOSIS — Y929 Unspecified place or not applicable: Secondary | ICD-10-CM | POA: Diagnosis not present

## 2019-09-04 LAB — CBC WITH DIFFERENTIAL/PLATELET
Abs Immature Granulocytes: 0.04 10*3/uL (ref 0.00–0.07)
Basophils Absolute: 0.1 10*3/uL (ref 0.0–0.1)
Basophils Relative: 1 %
Eosinophils Absolute: 0.1 10*3/uL (ref 0.0–0.5)
Eosinophils Relative: 2 %
HCT: 39 % (ref 39.0–52.0)
Hemoglobin: 12.9 g/dL — ABNORMAL LOW (ref 13.0–17.0)
Immature Granulocytes: 1 %
Lymphocytes Relative: 32 %
Lymphs Abs: 2.3 10*3/uL (ref 0.7–4.0)
MCH: 30.1 pg (ref 26.0–34.0)
MCHC: 33.1 g/dL (ref 30.0–36.0)
MCV: 90.9 fL (ref 80.0–100.0)
Monocytes Absolute: 0.6 10*3/uL (ref 0.1–1.0)
Monocytes Relative: 8 %
Neutro Abs: 4 10*3/uL (ref 1.7–7.7)
Neutrophils Relative %: 56 %
Platelets: 388 10*3/uL (ref 150–400)
RBC: 4.29 MIL/uL (ref 4.22–5.81)
RDW: 12.1 % (ref 11.5–15.5)
WBC: 7.2 10*3/uL (ref 4.0–10.5)
nRBC: 0 % (ref 0.0–0.2)

## 2019-09-04 LAB — COMPREHENSIVE METABOLIC PANEL
ALT: 13 U/L (ref 0–44)
AST: 14 U/L — ABNORMAL LOW (ref 15–41)
Albumin: 4 g/dL (ref 3.5–5.0)
Alkaline Phosphatase: 55 U/L (ref 38–126)
Anion gap: 7 (ref 5–15)
BUN: 29 mg/dL — ABNORMAL HIGH (ref 6–20)
CO2: 29 mmol/L (ref 22–32)
Calcium: 8.7 mg/dL — ABNORMAL LOW (ref 8.9–10.3)
Chloride: 100 mmol/L (ref 98–111)
Creatinine, Ser: 0.94 mg/dL (ref 0.61–1.24)
GFR calc Af Amer: >60 mL/min (ref >60)
GFR calc non Af Amer: >60 mL/min (ref >60)
Glucose, Bld: 119 mg/dL — ABNORMAL HIGH (ref 70–99)
Potassium: 3.9 mmol/L (ref 3.5–5.1)
Sodium: 136 mmol/L (ref 135–145)
Total Bilirubin: 0.6 mg/dL (ref 0.3–1.2)
Total Protein: 6.7 g/dL (ref 6.5–8.1)

## 2019-09-04 LAB — PROTIME-INR
INR: 1 (ref 0.8–1.2)
Prothrombin Time: 12.9 seconds (ref 11.4–15.2)

## 2019-09-04 LAB — TYPE AND SCREEN
ABO/RH(D): O POS
Antibody Screen: NEGATIVE

## 2019-09-04 MED ORDER — LIDOCAINE-EPINEPHRINE 2 %-1:100000 IJ SOLN
20.0000 mL | Freq: Once | INTRAMUSCULAR | Status: AC
  Administered 2019-09-04: 20 mL via INTRADERMAL
  Filled 2019-09-04: qty 1

## 2019-09-04 MED ORDER — HYDROMORPHONE HCL 1 MG/ML IJ SOLN
1.0000 mg | Freq: Once | INTRAMUSCULAR | Status: DC
  Filled 2019-09-04: qty 1

## 2019-09-04 MED ORDER — IOHEXOL 300 MG/ML  SOLN
75.0000 mL | Freq: Once | INTRAMUSCULAR | Status: AC | PRN
  Administered 2019-09-04: 75 mL via INTRAVENOUS

## 2019-09-04 NOTE — ED Triage Notes (Signed)
Pt to room 3 via w/c from lobby; pt reports on the 19th had plastic surgery/liposuction in [facility]; drain remains in place to abd; st sudden onset PTA of swelling to rt side of chest--denies pain; pt has compression shirt in place

## 2019-09-04 NOTE — H&P (Addendum)
  Subjective:     Patient ID: Edwin Cox is a 49 y.o. male.  11 days post op abdominoplasty and excision bilateral chest gynecomastia. Acute onset right chest swelling today after turning, transferred from [facility] ED. CT chest completed noted right chest hematoma.    Patient highest weight 415 lb. Underwent gastric bypass June 2016. Current weight is lowest.   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 Lives with parents.   CBC    Component Value Date/Time   WBC 7.2 09/04/2019 2252   RBC 4.29 09/04/2019 2252   HGB 12.9 (L) 09/04/2019 2252   HCT 39.0 09/04/2019 2252   PLT 388 09/04/2019 2252   MCV 90.9 09/04/2019 2252   MCH 30.1 09/04/2019 2252   MCHC 33.1 09/04/2019 2252   RDW 12.1 09/04/2019 2252   LYMPHSABS 2.3 09/04/2019 2252   MONOABS 0.6 09/04/2019 2252   EOSABS 0.1 09/04/2019 2252   BASOSABS 0.1 09/04/2019 2252      Objective:   Physical Exam  Temp:  [98.1 F (36.7 C)-98.3 F (36.8 C)] 98.3 F (36.8 C) (03/01 2352) Pulse Rate:  [96-143] 115 (03/01 2352) Resp:  [18-20] 20 (03/01 2352) BP: (100-139)/(73-89) 125/73 (03/01 2352) SpO2:  [98 %-100 %] 100 % (03/01 2352) Weight:  [88.5 kg] 88.5 kg (03/01 2202) Pulmonary/Chest:  Clear to auscultation CV: normal heart sounds Abdominal: Soft. incision intact left JP drain in place serosanginous  Chest incisions intact with right chest swelling consistent with hematoma    Assessment:     Hematoma right chest History gastric bypass S/p excision bilateral gynecomastia, abdominoplasty    Plan:     Plan OR now for evacuation hematoma right chest, drain placement.

## 2019-09-04 NOTE — ED Notes (Signed)
Pt has extreme swelling on right breast area, with incisions on both breasts from surgery. Pt has jp drains from surgery sites.

## 2019-09-04 NOTE — ED Provider Notes (Signed)
[facility] Emergency Department Provider Note  ____________________________________________   First MD Initiated Contact with Patient 09/04/19 2204     (approximate)  I have reviewed the triage vital signs and the nursing notes.   HISTORY  Chief Complaint Post-op Problem    HPI Edwin Cox is a 49 y.o. male  Here with massive swelling to right chest. Pt is s/p recent liposuction, breast reduction after bariatric surgery and weight loss. He states he had been recovering well. Had two drains removed in clinic recently. He rolled over in bed today then experienced acute onset of progressive severe swelling to his right anterior chest. This swelling has progressively worsened since then and is now spreading across his chest. He has associated moderate aching pain. He has begun having a small amount of drainage from the lateral aspect of his wound as well. Left sided drain has had minimal output. No trauma. He had no pain or swelling prior to this episode turning over.        Past Medical History:  Diagnosis Date  . Diabetes mellitus without complication (HCC)    borderline on no meds   . GERD (gastroesophageal reflux disease)   . Hypertension   . Obesity     Patient Active Problem List   Diagnosis Date Noted  . Elevated PSA 08/11/2019  . Family history of prostate cancer 08/11/2019  . Prediabetes 12/23/2015  . Hypertriglyceridemia 12/23/2015  . Lumbosacral pain 12/23/2015  . S/P laparoscopic sleeve gastrectomy 12/23/2015  . Essential hypertension 05/04/2014  . Morbid obesity with BMI of 50.0-59.9, adult (HCC) 05/04/2014  . Chest pain 05/03/2014    Past Surgical History:  Procedure Laterality Date  . EYE SURGERY     corneal transplant in both eyes   . LAPAROSCOPIC GASTRIC SLEEVE RESECTION N/A 12/23/2015   Procedure: LAPAROSCOPIC GASTRIC SLEEVE RESECTION, UPPER ENDO;  Surgeon: Gaynelle Adu, MD;  Location: [facility] ORS;  Service: General;  Laterality:  N/A;  . UPPER GI ENDOSCOPY  12/23/2015   Procedure: UPPER GI ENDOSCOPY;  Surgeon: Gaynelle Adu, MD;  Location: [facility] ORS;  Service: General;;    Prior to Admission medications   Medication Sig Start Date End Date Taking? Authorizing Provider  amLODipine (NORVASC) 10 MG tablet Take 10 mg by mouth daily. 03/27/19   [provider]  lisinopril-hydrochlorothiazide (PRINZIDE,ZESTORETIC) 20-12.5 MG per tablet Take 2 tablets by mouth daily.    [provider]  Multiple Vitamin (MULTI-VITAMIN) tablet Take by mouth.    [provider]  oxyCODONE (ROXICODONE) 5 MG/5ML solution Take 5-10 mLs (5-10 mg total) by mouth every 4 (four) hours as needed for moderate pain or severe pain. 12/25/15   Gaynelle Adu, MD  sildenafil (VIAGRA) 100 MG tablet Take 100 mg by mouth daily as needed. 05/29/19   [provider]    Allergies Patient has no known allergies.  Family History  Problem Relation Age of Onset  . Diabetes Mother   . Coronary artery disease Maternal Grandfather     Social History Social History   Tobacco Use  . Smoking status: Former Smoker    Quit date: 07/07/2011    Years since quitting: 8.1  . Smokeless tobacco: Former Engineer, water Use Topics  . Alcohol use: No  . Drug use: No    Review of Systems  Review of Systems  Constitutional: Negative for chills and fever.  HENT: Negative for sore throat.   Respiratory: Negative for shortness of breath.   Cardiovascular: Positive for chest  pain.  Gastrointestinal: Negative for abdominal pain.  Genitourinary: Negative for flank pain.  Musculoskeletal: Negative for neck pain.  Skin: Negative for rash and wound.  Allergic/Immunologic: Negative for immunocompromised state.  Neurological: Negative for numbness.  Hematological: Does not bruise/bleed easily.  All other systems reviewed and are negative.    ____________________________________________  PHYSICAL EXAM:      VITAL SIGNS: ED Triage Vitals    Enc Vitals Group     BP 09/04/19 2156 139/89     Pulse Rate 09/04/19 2156 (!) 143     Resp 09/04/19 2156 20     Temp 09/04/19 2156 98.1 F (36.7 C)     Temp Source 09/04/19 2156 Oral     SpO2 09/04/19 2156 98 %     Weight 09/04/19 2202 195 lb (88.5 kg)     Height 09/04/19 2202 6' (1.829 m)     Head Circumference --      Peak Flow --      Pain Score 09/04/19 2202 0     Pain Loc --      Pain Edu? --      Excl. in GC? --      Physical Exam Vitals and nursing note reviewed.  Constitutional:      General: He is not in acute distress.    Appearance: He is well-developed.  HENT:     Head: Normocephalic and atraumatic.  Eyes:     Conjunctiva/sclera: Conjunctivae normal.  Cardiovascular:     Rate and Rhythm: Normal rate and regular rhythm.     Heart sounds: Normal heart sounds.     Comments: Large hematoma noted to right anterior chest wall, superior and lateral to incision site. There is marked skin tenting and tenderness. Bloody drainage noted to lateral aspect of surgical incision, possibly at prior drain or stay suture site, but the site itself looks to be intact. Pulmonary:     Effort: Pulmonary effort is normal. No respiratory distress.     Breath sounds: No wheezing.  Abdominal:     General: There is no distension.  Musculoskeletal:     Cervical back: Neck supple.  Skin:    General: Skin is warm.     Capillary Refill: Capillary refill takes less than 2 seconds.     Findings: No rash.  Neurological:     Mental Status: He is alert and oriented to person, place, and time.     Motor: No abnormal muscle tone.       ____________________________________________   LABS (all labs ordered are listed, but only abnormal results are displayed)  Labs Reviewed  CBC WITH DIFFERENTIAL/PLATELET - Abnormal; Notable for the following components:      Result Value   Hemoglobin 12.9 (*)    All other components within normal limits  COMPREHENSIVE METABOLIC PANEL - Abnormal;  Notable for the following components:   Glucose, Bld 119 (*)    BUN 29 (*)    Calcium 8.7 (*)    AST 14 (*)    All other components within normal limits  RESPIRATORY PANEL BY RT PCR (FLU A&B, COVID)  PROTIME-INR  TYPE AND SCREEN    ____________________________________________  EKG: None ________________________________________  RADIOLOGY All imaging, including plain films, CT scans, and ultrasounds, independently reviewed by me, and interpretations confirmed via formal radiology reads.  ED MD interpretation:   CT: Large hematoma right chest with acute chest wall hematoma  Official radiology report(s): CT Chest W Contrast  Result Date: 09/04/2019 CLINICAL DATA:  Chest wall hematoma EXAM: CT CHEST WITH CONTRAST TECHNIQUE: Multidetector CT imaging of the chest was performed during intravenous contrast administration. CONTRAST:  25mL OMNIPAQUE IOHEXOL 300 MG/ML  SOLN COMPARISON:  Chest x-ray 05/03/2014 FINDINGS: Cardiovascular: Nonaneurysmal aorta. Normal heart size. Coronary vascular calcification. No pericardial effusion. Mediastinum/Nodes: Midline trachea. No thyroid mass. No significant adenopathy. Esophagus within normal limits. Lungs/Pleura: Lungs are clear. No pleural effusion or pneumothorax. Upper Abdomen: No acute abnormality. Musculoskeletal: Degenerative changes of the spine. No fracture or suspicious bone lesion. Large hyperdense collection within the right breast/chest wall. This measures at least 16.9 x 7.1 by 11 cm and is consistent with an acute chest wall hematoma. Small foci of gas within the upper outer chest wall presumably related to previous drainage catheter. Slightly caudal to the gas bubbles, there is patchy focus of hyperenhancement, series 3, image number 64, consistent with active extravasation/bleeding. Delayed images demonstrate additional foci of globular contrast within the lateral aspect of the hematoma approximately at the level of the nipple. Hematoma  demonstrates reverse U shape on coronal views and extends medially towards the sternum but does not cross to the contralateral chest wall. IMPRESSION: 1. Large hyperdense collection within the subcutaneous soft tissues of the right breast/chest, measuring at least 16.9 cm, consistent with an acute chest wall hematoma. There is active extravasation/bleeding at the superolateral aspect of the hematoma. 2. Clear lung fields Critical Value/emergent results were called by telephone at the time of interpretation on 09/04/2019 at 11:26 pm to provider Duffy Bruce , who verbally acknowledged these results. Electronically Signed   By: Donavan Foil M.D.   On: 09/04/2019 23:26    ____________________________________________  PROCEDURES   Procedure(s) performed (including Critical Care):  Procedures  ____________________________________________  INITIAL IMPRESSION / MDM / [place] / ED COURSE  As part of my medical decision making, I reviewed the following data within the [place] notes reviewed and incorporated, Old chart reviewed, Notes from prior ED visits, and [place] Controlled Substance Database       *Edwin Cox was evaluated in Emergency Department on 09/04/2019 for the symptoms described in the history of present illness. He was evaluated in the context of the global COVID-19 pandemic, which necessitated consideration that the patient might be at risk for infection with the SARS-CoV-2 virus that causes COVID-19. Institutional protocols and algorithms that pertain to the evaluation of patients at risk for COVID-19 are in a state of rapid change based on information released by regulatory bodies including the CDC and federal and state organizations. These policies and algorithms were followed during the patient's care in the ED.  Some ED evaluations and interventions may be delayed as a result of limited staffing during the pandemic.*     Medical Decision  Making:  49 yo M here with swelling to right chest s/p recent breast reduction. Suspect hematoma in setting of recent drain removal, rolling over in bed. He is not on blood thinners. There is a superficial area of bleeding/oozing noted to superolateral chest. Given the severe skin tenting and tension with bulging/near dehiscence of his nipple and entire breast op site, I used a sterile 18g needle to superficially probe this site following full sterile procedure, with return of hematoma blood, mild improvement in swelling. This was only to temporize while I discussed with Dr. Iran Planas, who will take to [facility] at [facility]. He is HDS. Type and screen sent. CT scan confirms active extrav on hematoma. Will go to OR. He is NPO.  ____________________________________________  FINAL CLINICAL IMPRESSION(S) / ED DIAGNOSES  Final diagnoses:  Hematoma     MEDICATIONS GIVEN DURING THIS VISIT:  Medications  HYDROmorphone (DILAUDID) injection 1 mg (1 mg Intravenous Refused 09/04/19 2243)  lidocaine-EPINEPHrine (XYLOCAINE W/EPI) 2 %-1:100000 (with pres) injection 20 mL (20 mLs Intradermal Given 09/04/19 2242)  iohexol (OMNIPAQUE) 300 MG/ML solution 75 mL (75 mLs Intravenous Contrast Given 09/04/19 2300)     ED Discharge Orders    None       Note:  This document was prepared using Dragon voice recognition software and may include unintentional dictation errors.   Shaune Pollack, MD 09/04/19 [phone]

## 2019-09-05 ENCOUNTER — Encounter (HOSPITAL_COMMUNITY): Payer: Self-pay | Admitting: Plastic Surgery

## 2019-09-05 ENCOUNTER — Encounter (HOSPITAL_COMMUNITY)
Admission: RE | Disposition: A | Discharge status: Home / Self Care | Payer: Self-pay | Source: Ambulatory Visit | Attending: Plastic Surgery

## 2019-09-05 ENCOUNTER — Inpatient Hospital Stay (HOSPITAL_COMMUNITY): Payer: 59 | Admitting: Certified Registered"

## 2019-09-05 ENCOUNTER — Ambulatory Visit (HOSPITAL_COMMUNITY)
Admission: RE | Admit: 2019-09-05 | Discharge: 2019-09-05 | Disposition: A | Discharge status: Home / Self Care | Payer: 59 | Source: Ambulatory Visit | Attending: Plastic Surgery | Admitting: Plastic Surgery

## 2019-09-05 DIAGNOSIS — L7632 Postprocedural hematoma of skin and subcutaneous tissue following other procedure: Secondary | ICD-10-CM | POA: Insufficient documentation

## 2019-09-05 DIAGNOSIS — Z9884 Bariatric surgery status: Secondary | ICD-10-CM | POA: Insufficient documentation

## 2019-09-05 DIAGNOSIS — Z87891 Personal history of nicotine dependence: Secondary | ICD-10-CM | POA: Diagnosis not present

## 2019-09-05 DIAGNOSIS — I10 Essential (primary) hypertension: Secondary | ICD-10-CM | POA: Insufficient documentation

## 2019-09-05 DIAGNOSIS — S20211A Contusion of right front wall of thorax, initial encounter: Secondary | ICD-10-CM | POA: Diagnosis present

## 2019-09-05 DIAGNOSIS — Y838 Other surgical procedures as the cause of abnormal reaction of the patient, or of later complication, without mention of misadventure at the time of the procedure: Secondary | ICD-10-CM | POA: Diagnosis not present

## 2019-09-05 HISTORY — PX: HEMATOMA EVACUATION: SHX5118

## 2019-09-05 LAB — RESPIRATORY PANEL BY RT PCR (FLU A&B, COVID)
Influenza A by PCR: NEGATIVE
Influenza B by PCR: NEGATIVE
SARS Coronavirus 2 by RT PCR: NEGATIVE

## 2019-09-05 SURGERY — EVACUATION HEMATOMA
Anesthesia: General | Site: Chest | Laterality: Right

## 2019-09-05 MED ORDER — SUCCINYLCHOLINE CHLORIDE 200 MG/10ML IV SOSY
PREFILLED_SYRINGE | INTRAVENOUS | Status: DC | PRN
  Administered 2019-09-05: 140 mg via INTRAVENOUS

## 2019-09-05 MED ORDER — SUCCINYLCHOLINE CHLORIDE 200 MG/10ML IV SOSY
PREFILLED_SYRINGE | INTRAVENOUS | Status: AC
  Filled 2019-09-05: qty 10

## 2019-09-05 MED ORDER — CEFAZOLIN SODIUM-DEXTROSE 1-4 GM/50ML-% IV SOLN
INTRAVENOUS | Status: AC
  Filled 2019-09-05: qty 50

## 2019-09-05 MED ORDER — LIDOCAINE 2% (20 MG/ML) 5 ML SYRINGE
INTRAMUSCULAR | Status: DC | PRN
  Administered 2019-09-05: 100 mg via INTRAVENOUS

## 2019-09-05 MED ORDER — ONDANSETRON HCL 4 MG/2ML IJ SOLN
INTRAMUSCULAR | Status: DC | PRN
  Administered 2019-09-05: 4 mg via INTRAVENOUS

## 2019-09-05 MED ORDER — DEXAMETHASONE SODIUM PHOSPHATE 10 MG/ML IJ SOLN
INTRAMUSCULAR | Status: DC | PRN
  Administered 2019-09-05: 10 mg via INTRAVENOUS

## 2019-09-05 MED ORDER — LACTATED RINGERS IV SOLN: INTRAVENOUS | Status: DC | PRN

## 2019-09-05 MED ORDER — HEMOSTATIC AGENTS (NO CHARGE) OPTIME
TOPICAL | Status: DC | PRN
  Administered 2019-09-05: 1 via TOPICAL

## 2019-09-05 MED ORDER — PHENYLEPHRINE 40 MCG/ML (10ML) SYRINGE FOR IV PUSH (FOR BLOOD PRESSURE SUPPORT)
PREFILLED_SYRINGE | INTRAVENOUS | Status: AC
  Filled 2019-09-05: qty 20

## 2019-09-05 MED ORDER — SUFENTANIL CITRATE 50 MCG/ML IV SOLN
INTRAVENOUS | Status: DC | PRN
  Administered 2019-09-05: 20 ug via INTRAVENOUS

## 2019-09-05 MED ORDER — PROPOFOL 10 MG/ML IV BOLUS
INTRAVENOUS | Status: AC
  Filled 2019-09-05: qty 20

## 2019-09-05 MED ORDER — LIDOCAINE 2% (20 MG/ML) 5 ML SYRINGE
INTRAMUSCULAR | Status: AC
  Filled 2019-09-05: qty 5

## 2019-09-05 MED ORDER — PHENYLEPHRINE HCL (PRESSORS) 10 MG/ML IV SOLN
INTRAVENOUS | Status: DC | PRN
  Administered 2019-09-05: 160 ug via INTRAVENOUS
  Administered 2019-09-05: 200 ug via INTRAVENOUS
  Administered 2019-09-05 (×2): 120 ug via INTRAVENOUS
  Administered 2019-09-05: 200 ug via INTRAVENOUS

## 2019-09-05 MED ORDER — FENTANYL CITRATE (PF) 100 MCG/2ML IJ SOLN: 25.0000 ug | INTRAMUSCULAR | Status: DC | PRN

## 2019-09-05 MED ORDER — ACETAMINOPHEN 10 MG/ML IV SOLN
INTRAVENOUS | Status: AC
  Filled 2019-09-05: qty 100

## 2019-09-05 MED ORDER — MIDAZOLAM HCL 2 MG/2ML IJ SOLN
INTRAMUSCULAR | Status: AC
  Filled 2019-09-05: qty 2

## 2019-09-05 MED ORDER — DEXAMETHASONE SODIUM PHOSPHATE 10 MG/ML IJ SOLN
INTRAMUSCULAR | Status: AC
  Filled 2019-09-05: qty 1

## 2019-09-05 MED ORDER — STERILE WATER FOR IRRIGATION IR SOLN
Status: DC | PRN
  Administered 2019-09-05: 500 mL

## 2019-09-05 MED ORDER — PROMETHAZINE HCL 25 MG/ML IJ SOLN: 6.2500 mg | INTRAMUSCULAR | Status: DC | PRN

## 2019-09-05 MED ORDER — CEFAZOLIN SODIUM 1 G IJ SOLR
INTRAMUSCULAR | Status: AC
  Filled 2019-09-05: qty 20

## 2019-09-05 MED ORDER — ACETAMINOPHEN 10 MG/ML IV SOLN
INTRAVENOUS | Status: DC | PRN
  Administered 2019-09-05: 1000 mg via INTRAVENOUS

## 2019-09-05 MED ORDER — SODIUM CHLORIDE (PF) 0.9 % IJ SOLN
INTRAMUSCULAR | Status: AC
  Filled 2019-09-05: qty 10

## 2019-09-05 MED ORDER — PROPOFOL 10 MG/ML IV BOLUS
INTRAVENOUS | Status: DC | PRN
  Administered 2019-09-05: 170 mg via INTRAVENOUS

## 2019-09-05 MED ORDER — ALBUMIN HUMAN 5 % IV SOLN: INTRAVENOUS | Status: DC | PRN

## 2019-09-05 MED ORDER — SUFENTANIL CITRATE 50 MCG/ML IV SOLN
INTRAVENOUS | Status: AC
  Filled 2019-09-05: qty 1

## 2019-09-05 MED ORDER — ONDANSETRON HCL 4 MG/2ML IJ SOLN
INTRAMUSCULAR | Status: AC
  Filled 2019-09-05: qty 2

## 2019-09-05 MED ORDER — MIDAZOLAM HCL 2 MG/2ML IJ SOLN
INTRAMUSCULAR | Status: DC | PRN
  Administered 2019-09-05: 2 mg via INTRAVENOUS

## 2019-09-05 MED ORDER — 0.9 % SODIUM CHLORIDE (POUR BTL) OPTIME
TOPICAL | Status: DC | PRN
  Administered 2019-09-05 (×2): 1000 mL

## 2019-09-05 SURGICAL SUPPLY — 45 items
APPLIER CLIP 11 MED OPEN (CLIP)
BENZOIN TINCTURE PRP APPL 2/3 (GAUZE/BANDAGES/DRESSINGS) IMPLANT
BINDER BREAST LRG (GAUZE/BANDAGES/DRESSINGS) ×1 IMPLANT
BLADE HEX COATED 2.75 (ELECTRODE) ×1 IMPLANT
CANISTER SUCT 3000ML PPV (MISCELLANEOUS) ×1 IMPLANT
CLIP APPLIE 11 MED OPEN (CLIP) IMPLANT
CLOSURE WOUND 1/2 X4 (GAUZE/BANDAGES/DRESSINGS)
COVER WAND RF STERILE (DRAPES) ×1 IMPLANT
DERMABOND ADVANCED (GAUZE/BANDAGES/DRESSINGS) ×2
DERMABOND ADVANCED .7 DNX12 (GAUZE/BANDAGES/DRESSINGS) IMPLANT
DRAIN CHANNEL 19F RND (DRAIN) ×2 IMPLANT
DRAPE LAPAROSCOPIC ABDOMINAL (DRAPES) IMPLANT
DRAPE LAPAROTOMY 100X72 PEDS (DRAPES) ×2 IMPLANT
DRSG PAD ABDOMINAL 8X10 ST (GAUZE/BANDAGES/DRESSINGS) ×4 IMPLANT
ELECT REM PT RETURN 9FT ADLT (ELECTROSURGICAL) ×3
ELECTRODE REM PT RTRN 9FT ADLT (ELECTROSURGICAL) ×1 IMPLANT
EVACUATOR SILICONE 100CC (DRAIN) ×2 IMPLANT
GAUZE SPONGE 4X4 12PLY STRL (GAUZE/BANDAGES/DRESSINGS) IMPLANT
GLOVE BIOGEL PI IND STRL 7.0 (GLOVE) ×1 IMPLANT
GLOVE BIOGEL PI INDICATOR 7.0 (GLOVE) ×2
GLOVE SS BIOGEL STRL SZ 7 (GLOVE) ×1 IMPLANT
GLOVE SUPERSENSE BIOGEL SZ 7 (GLOVE)
GLOVE SURG SS PI 7.0 STRL IVOR (GLOVE) ×2 IMPLANT
GOWN STRL REUS W/ TWL XL LVL3 (GOWN DISPOSABLE) ×2 IMPLANT
GOWN STRL REUS W/TWL XL LVL3 (GOWN DISPOSABLE)
HEMOSTAT SNOW SURGICEL 2X4 (HEMOSTASIS) ×2 IMPLANT
KIT BASIN OR (CUSTOM PROCEDURE TRAY) ×3 IMPLANT
MANIFOLD NEPTUNE II (INSTRUMENTS) ×2 IMPLANT
MARKER SKIN DUAL TIP RULER LAB (MISCELLANEOUS) ×1 IMPLANT
NEEDLE HYPO 22GX1.5 SAFETY (NEEDLE) IMPLANT
NS IRRIG 1000ML POUR BTL (IV SOLUTION) ×3 IMPLANT
PACK GENERAL/GYN (CUSTOM PROCEDURE TRAY) ×3 IMPLANT
PENCIL SMOKE EVACUATOR (MISCELLANEOUS) ×3 IMPLANT
PIN SAFETY STERILE (MISCELLANEOUS) ×2 IMPLANT
SPONGE DRAIN TRACH 4X4 STRL 2S (GAUZE/BANDAGES/DRESSINGS) IMPLANT
STAPLER VISISTAT 35W (STAPLE) IMPLANT
STRIP CLOSURE SKIN 1/2X4 (GAUZE/BANDAGES/DRESSINGS) IMPLANT
SUT ETHILON 2 0 FS 18 (SUTURE) ×2 IMPLANT
SUT ETHILON 3 0 PS 1 (SUTURE) IMPLANT
SUT MNCRL AB 4-0 PS2 18 (SUTURE) ×2 IMPLANT
SUT MON AB 3-0 SH 27 (SUTURE) ×2
SUT MON AB 3-0 SH27 (SUTURE) IMPLANT
SUT VIC AB 3-0 SH 18 (SUTURE) ×1 IMPLANT
SYR CONTROL 10ML LL (SYRINGE) IMPLANT
TOWEL GREEN STERILE (TOWEL DISPOSABLE) ×3 IMPLANT

## 2019-09-05 NOTE — Anesthesia Postprocedure Evaluation (Signed)
Anesthesia Post Note  Patient: Edwin Cox  Procedure(s) Performed: Evacuation right chest hematoma (Right Chest)     Patient location during evaluation: PACU Anesthesia Type: General Level of consciousness: sedated Pain management: pain level controlled Vital Signs Assessment: post-procedure vital signs reviewed and stable Respiratory status: spontaneous breathing and respiratory function stable Cardiovascular status: stable Postop Assessment: no apparent nausea or vomiting Anesthetic complications: no    Last Vitals:  Vitals:   09/05/19 0230 09/05/19 0245  BP: 116/71   Pulse: 78   Resp: (!) 5   Temp:  (!) 36.4 C  SpO2: 100%     Last Pain:  Vitals:   09/05/19 0230  PainSc: 0-No pain                 Shantanique Hodo Luken

## 2019-09-05 NOTE — Anesthesia Preprocedure Evaluation (Signed)
Anesthesia Evaluation  Patient identified by MRN, date of birth, ID band Patient awake    Reviewed: Allergy & Precautions, NPO status , Patient's Chart, lab work & pertinent test results  History of Anesthesia Complications Negative for: history of anesthetic complications  Airway Mallampati: II  TM Distance: >3 FB Neck ROM: Full    Dental no notable dental hx. (+) Dental Advisory Given   Pulmonary former smoker,    Pulmonary exam normal        Cardiovascular hypertension, Normal cardiovascular exam     Neuro/Psych negative neurological ROS  negative psych ROS   GI/Hepatic negative GI ROS, Neg liver ROS,   Endo/Other    Renal/GU negative Renal ROS     Musculoskeletal   Abdominal   Peds  Hematology   Anesthesia Other Findings   Reproductive/Obstetrics                             Anesthesia Physical Anesthesia Plan  ASA: II and emergent  Anesthesia Plan: General   Post-op Pain Management:    Induction: Intravenous, Rapid sequence and Cricoid pressure planned  PONV Risk Score and Plan: 2 and Ondansetron and Dexamethasone  Airway Management Planned: Oral ETT  Additional Equipment:   Intra-op Plan:   Post-operative Plan: Extubation in OR  Informed Consent: I have reviewed the patients History and Physical, chart, labs and discussed the procedure including the risks, benefits and alternatives for the proposed anesthesia with the patient or authorized representative who has indicated his/her understanding and acceptance.     Dental advisory given  Plan Discussed with: CRNA and Anesthesiologist  Anesthesia Plan Comments:         Anesthesia Quick Evaluation

## 2019-09-05 NOTE — Op Note (Signed)
Operative Note   DATE OF OPERATION: 3.2.21  LOCATION:  Main OR-outpatient  SURGICAL DIVISION: Plastic Surgery  PREOPERATIVE DIAGNOSES:  1. Hematoma right chest 2. History bariatric surgery  POSTOPERATIVE DIAGNOSES:  same  PROCEDURE:  Evacuation right chest hematoma  SURGEON: Glenna Fellows MD MBA  ASSISTANT: none  ANESTHESIA:  General.   EBL: 600 ml hematoma, old clot evaucated  COMPLICATIONS: None immediate.   INDICATIONS FOR PROCEDURE:  The patient, Edwin Cox, is a 49 y.o. male born on March 25, 1971, is here for evacuation right chest hematoma acute onset after turning this evening. He is 11 days post operative from surgical excision bilateral chest gynecomastia post bariatric surgery.   FINDINGS: Large amount clot evacuated. NAC viable. Active bleeding vessel noted lateral anterior skin flap.   DESCRIPTION OF PROCEDURE:  The patient's operative site was marked with the patient in the preoperative area. The patient was taken to the operating room. SCDs were placed and IV antibiotics were given. The patient's operative site was prepped and draped in a sterile fashion. A time out was performed and all information was confirmed to be correct. Incision made in inframammary fold scar lateral to NAC. Entered cavity and evacuated large amount of clot as noted in findings. Cavity irrigated with saline. One active bleeding vessel noted and cauterized. Surgicel sheet placed in wound. 19 Fr JP placed in cavity and secured with 2-0 nylon. Closure completed with 3-0 monocryl in dermis and superficial fascia followed by 4-0 monocryl subcuticular skin closure. Dermabond applied. Dry dressing, breast binder applied.  The patient was allowed to wake from anesthesia, extubated and taken to the recovery room in satisfactory condition.   SPECIMENS: none  DRAINS: 19 Fr JP in right subcutaneous chest

## 2019-09-05 NOTE — Transfer of Care (Signed)
Immediate Anesthesia Transfer of Care Note  Patient: Edwin Cox  Procedure(s) Performed: EVACUATION HEMATOMA RIGHT CHEST (Right Chest)  Patient Location: PACU  Anesthesia Type:General  Level of Consciousness: awake, alert , oriented and patient cooperative  Airway & Oxygen Therapy: Patient Spontanous Breathing and Patient connected to nasal cannula oxygen  Post-op Assessment: Report given to RN, Post -op Vital signs reviewed and stable and Patient moving all extremities X 4  Post vital signs: Reviewed and stable  Last Vitals:  Vitals Value Taken Time  BP 108/74 09/05/19 0210  Temp 36.7 C 09/05/19 0209  Pulse 81 09/05/19 0215  Resp 17 09/05/19 0215  SpO2 96 % 09/05/19 0215  Vitals shown include unvalidated device data.  Last Pain:  Vitals:   09/05/19 0209  PainSc: 0-No pain         Complications: No apparent anesthesia complications

## 2019-09-05 NOTE — Anesthesia Procedure Notes (Signed)
Procedure Name: Intubation Date/Time: 09/05/2019 1:16 AM Performed by: Claris Che, CRNA Pre-anesthesia Checklist: Patient identified, Emergency Drugs available, Suction available, Patient being monitored and Timeout performed Patient Re-evaluated:Patient Re-evaluated prior to induction Oxygen Delivery Method: Circle system utilized Preoxygenation: Pre-oxygenation with 100% oxygen Induction Type: IV induction, Rapid sequence and Cricoid Pressure applied Laryngoscope Size: Mac and 4 Grade View: Grade I Tube type: Oral Tube size: 7.5 mm Number of attempts: 1 Airway Equipment and Method: Stylet Placement Confirmation: ETT inserted through vocal cords under direct vision,  positive ETCO2 and breath sounds checked- equal and bilateral Secured at: 24 cm Tube secured with: Tape Dental Injury: Teeth and Oropharynx as per pre-operative assessment

## 2020-09-22 IMAGING — CT CT CHEST W/ CM
2 of 6 series · 14 of 36 positions shown, 18 images · IV contrast (omnipaque)
Comparison: Chest x-ray 05/03/2014

CLINICAL DATA: Chest wall hematoma

EXAM:
CT CHEST WITH CONTRAST
TECHNIQUE: Multidetector CT imaging of the chest was performed during
intravenous contrast administration.
CONTRAST:  75mL OMNIPAQUE IOHEXOL 300 MG/ML  SOLN

[Series 3: axial st · axial · 0.98mm/px · z∈[-538,-308]mm · 13 of 135 slices shown, 17 images]
[im 10/135  mediastinal]
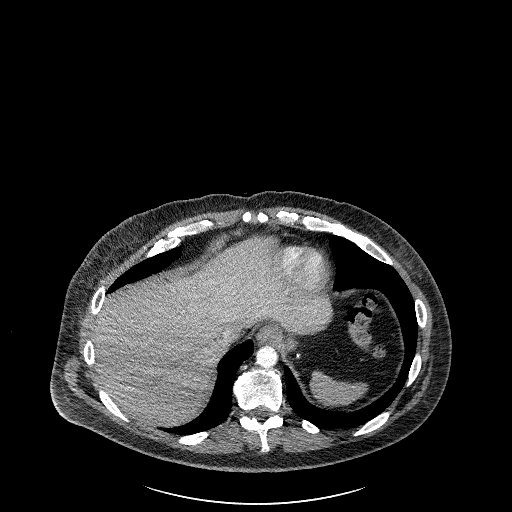
[im 10/135  lung]
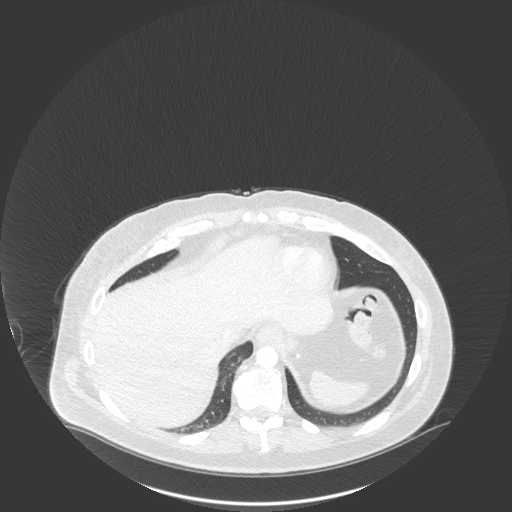
[im 20/135  lung]
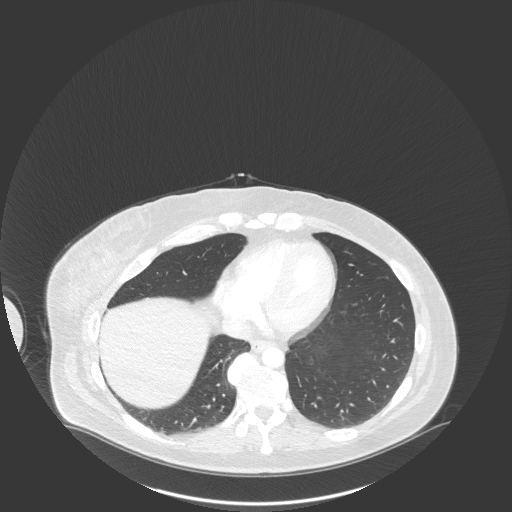
[im 29/135  lung]
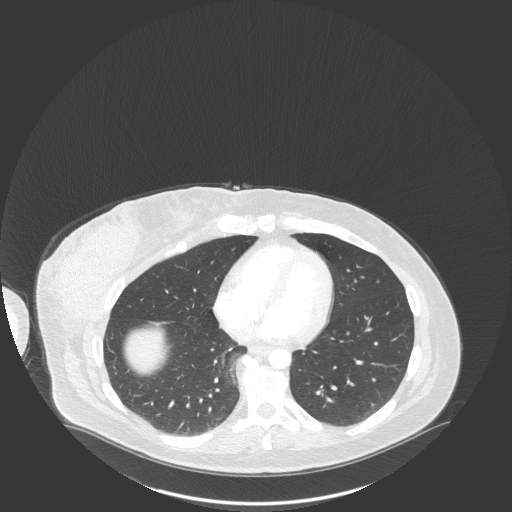
[im 39/135  lung]
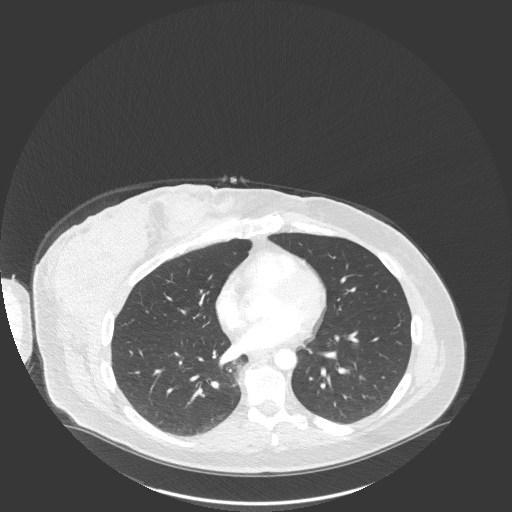
[im 48/135  mediastinal]
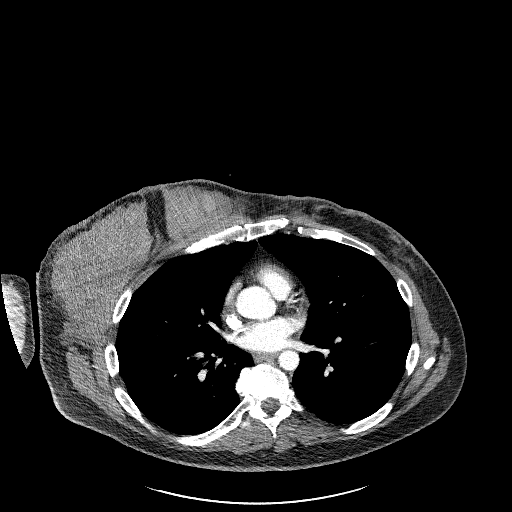
[im 48/135  lung]
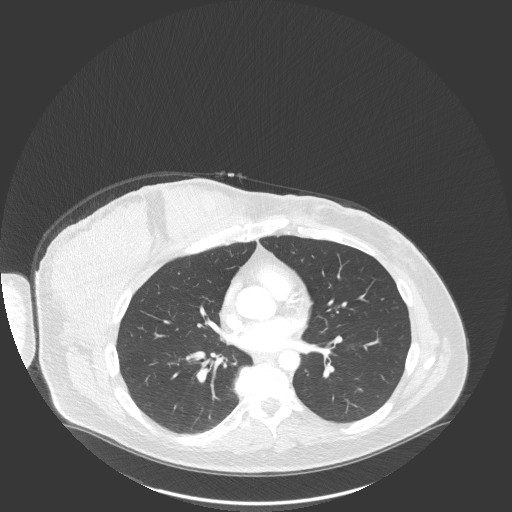
[im 58/135  lung]
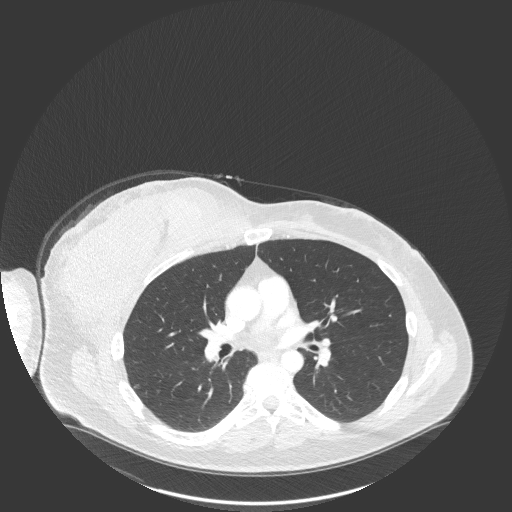
[im 68/135  lung]
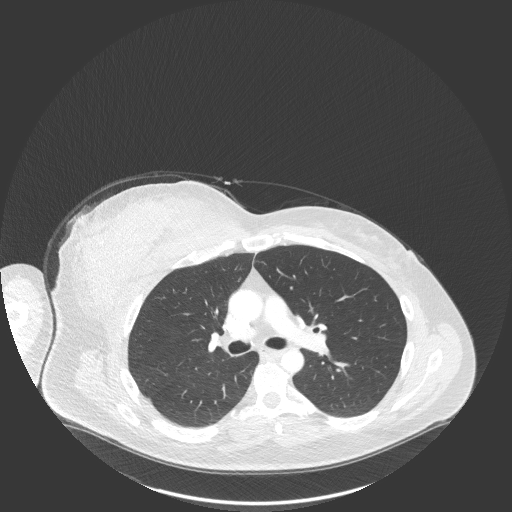
[im 77/135  lung]
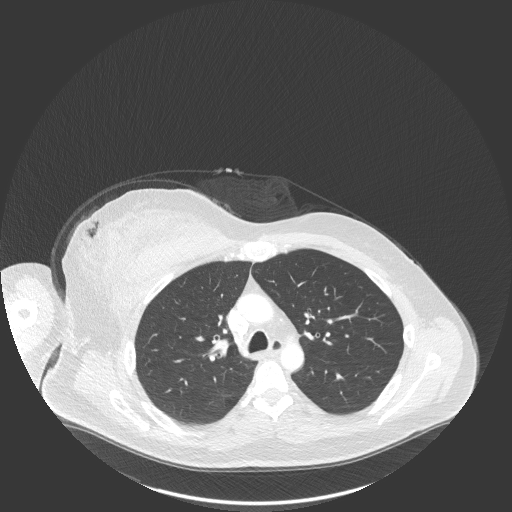
[im 87/135  mediastinal]
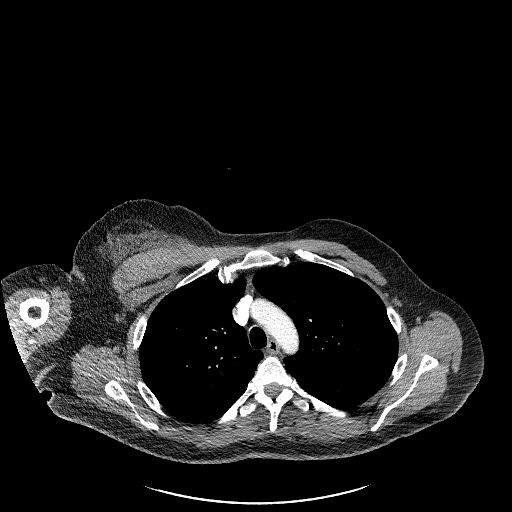
[im 87/135  lung]
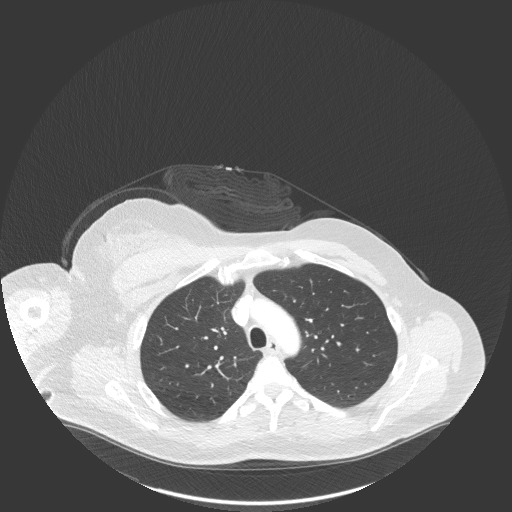
[im 96/135  lung]
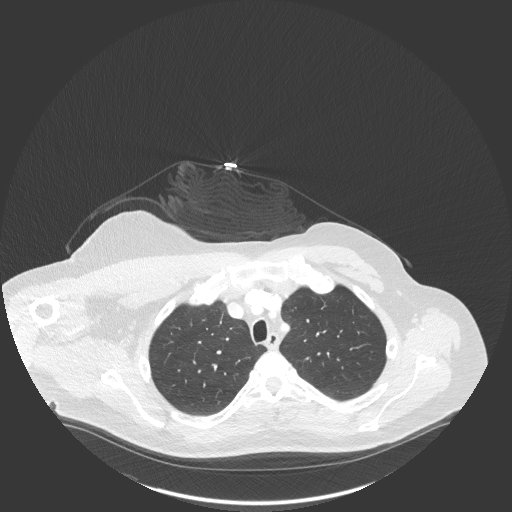
[im 106/135  lung]
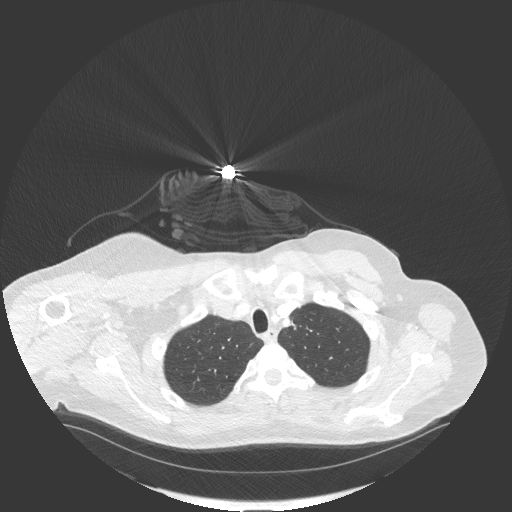
[im 115/135  lung]
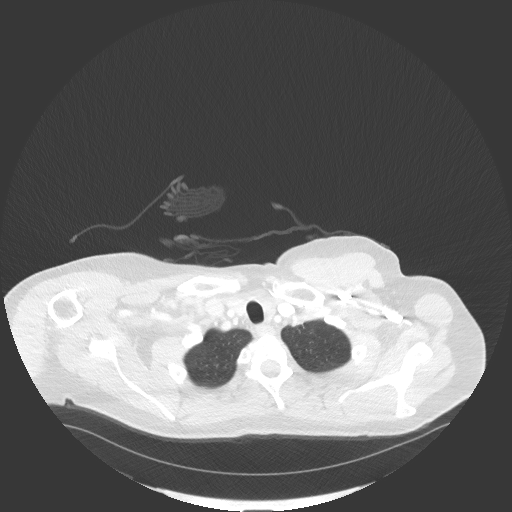
[im 125/135  mediastinal]
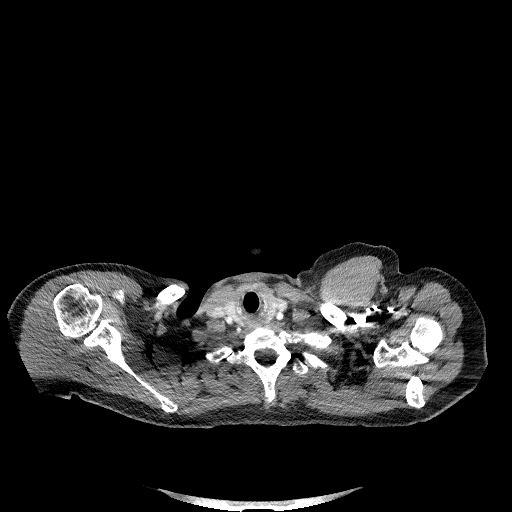
[im 125/135  lung]
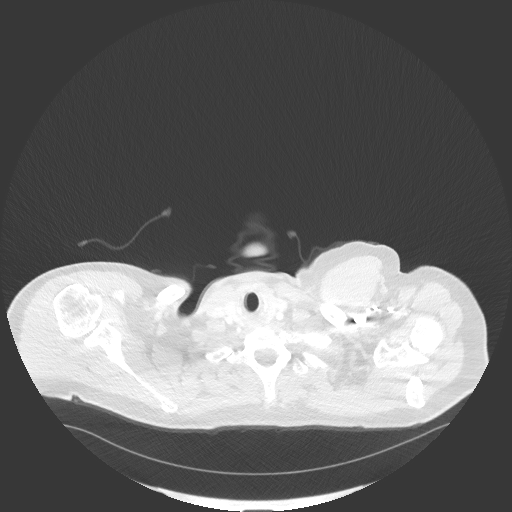

[Series 6: coronal · coronal · 0.58mm/px · 1 of 143 slices shown]
[im 72/143  lung]
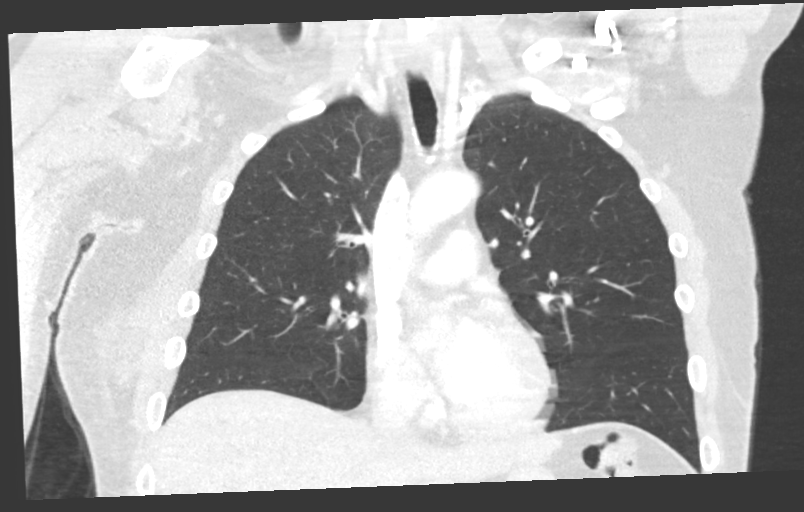

[14 of 36 positions shown; findings below may reference images not displayed]

FINDINGS: Cardiovascular: Nonaneurysmal aorta. Normal heart size. Coronary
vascular calcification. No pericardial effusion.

Mediastinum/Nodes: Midline trachea. No thyroid mass. No significant
adenopathy. Esophagus within normal limits.

Lungs/Pleura: Lungs are clear. No pleural effusion or pneumothorax.

Upper Abdomen: No acute abnormality.

Musculoskeletal: Degenerative changes of the spine. No fracture or
suspicious bone lesion.

Large hyperdense collection within the right breast/chest wall. This
measures at least 16.9 x 7.1 by 11 cm and is consistent with an
acute chest wall hematoma. Small foci of gas within the upper outer
chest wall presumably related to previous drainage catheter.
Slightly caudal to the gas bubbles, there is patchy focus of
hyperenhancement, series 3, image number 64, consistent with active
extravasation/bleeding. Delayed images demonstrate additional foci
of globular contrast within the lateral aspect of the hematoma
approximately at the level of the nipple. Hematoma demonstrates
reverse U shape on coronal views and extends medially towards the
sternum but does not cross to the contralateral chest wall.
IMPRESSION: 1. Large hyperdense collection within the subcutaneous soft tissues
of the right breast/chest, measuring at least 16.9 cm, consistent
with an acute chest wall hematoma. There is active
extravasation/bleeding at the superolateral aspect of the hematoma.
2. Clear lung fields

Critical Value/emergent results were called by telephone at the time
of interpretation on 09/04/2019 at [DATE] to provider KARL AUGUST OLULINE
, who verbally acknowledged these results.
